# Patient Record
Sex: Male | Born: 1980 | Race: White | Hispanic: No | Marital: Married | Smoking: Never smoker
Health system: Southern US, Community
[De-identification: ages and names within clinical notes are randomized; demographics above are authoritative.]

## PROBLEM LIST (undated history)

## (undated) DIAGNOSIS — G8929 Other chronic pain: Secondary | ICD-10-CM

## (undated) DIAGNOSIS — M549 Dorsalgia, unspecified: Secondary | ICD-10-CM

## (undated) HISTORY — PX: TONSILLECTOMY: SUR1361

## (undated) HISTORY — PX: LUMBAR DISC SURGERY: SHX700

## (undated) HISTORY — PX: WISDOM TOOTH EXTRACTION: SHX21

---
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
## 2011-10-17 ENCOUNTER — Encounter (HOSPITAL_COMMUNITY): Payer: Self-pay | Admitting: *Deleted

## 2011-10-17 ENCOUNTER — Emergency Department (HOSPITAL_COMMUNITY)
Admission: EM | Admit: 2011-10-17 | Discharge: 2011-10-17 | Disposition: A | Discharge status: Home / Self Care | Payer: Worker's Compensation | Attending: Emergency Medicine | Admitting: Emergency Medicine

## 2011-10-17 DIAGNOSIS — Y9269 Other specified industrial and construction area as the place of occurrence of the external cause: Secondary | ICD-10-CM | POA: Insufficient documentation

## 2011-10-17 DIAGNOSIS — M549 Dorsalgia, unspecified: Secondary | ICD-10-CM

## 2011-10-17 DIAGNOSIS — Z881 Allergy status to other antibiotic agents status: Secondary | ICD-10-CM | POA: Insufficient documentation

## 2011-10-17 DIAGNOSIS — M5380 Other specified dorsopathies, site unspecified: Secondary | ICD-10-CM | POA: Insufficient documentation

## 2011-10-17 DIAGNOSIS — M545 Low back pain, unspecified: Secondary | ICD-10-CM | POA: Insufficient documentation

## 2011-10-17 DIAGNOSIS — M62830 Muscle spasm of back: Secondary | ICD-10-CM

## 2011-10-17 DIAGNOSIS — X500XXA Overexertion from strenuous movement or load, initial encounter: Secondary | ICD-10-CM | POA: Insufficient documentation

## 2011-10-17 MED ORDER — OXYCODONE-ACETAMINOPHEN 5-325 MG PO TABS: ORAL_TABLET | ORAL | Status: AC

## 2011-10-17 MED ORDER — HYDROMORPHONE HCL PF 1 MG/ML IJ SOLN
1.0000 mg | Freq: Once | INTRAMUSCULAR | Status: AC
  Administered 2011-10-17: 1 mg via INTRAMUSCULAR
  Filled 2011-10-17: qty 1

## 2011-10-17 MED ORDER — DIAZEPAM 5 MG PO TABS
10.0000 mg | ORAL_TABLET | Freq: Once | ORAL | Status: AC
  Administered 2011-10-17: 10 mg via ORAL
  Filled 2011-10-17: qty 2

## 2011-10-17 MED ORDER — IBUPROFEN 800 MG PO TABS: 800.0000 mg | ORAL_TABLET | Freq: Three times a day (TID) | ORAL | Status: AC

## 2011-10-17 MED ORDER — KETOROLAC TROMETHAMINE 60 MG/2ML IM SOLN
60.0000 mg | Freq: Once | INTRAMUSCULAR | Status: AC
  Administered 2011-10-17: 60 mg via INTRAMUSCULAR
  Filled 2011-10-17: qty 2

## 2011-10-17 MED ORDER — DIAZEPAM 5 MG PO TABS: 5.0000 mg | ORAL_TABLET | Freq: Three times a day (TID) | ORAL | Status: AC | PRN

## 2011-10-17 NOTE — ED Notes (Signed)
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  Pain in his lower back with radiation to his right leg (pain is sharp) has been increasing since his injury on Friday.  Pain unrelieved by  Ibuprofen at home.  No numbness or tingling, no loss of bowel or bladder control.  Pt is ambulatory in triage

## 2011-10-17 NOTE — Discharge Instructions (Signed)
Back Pain, Adult Low back pain is very common. About 1 in 5 people have back pain.The cause of low back pain is rarely dangerous. The pain often gets better over time.About half of people with a sudden onset of back pain feel better in just 2 weeks. About 8 in 10 people feel better by 6 weeks.  CAUSES Some common causes of back pain include:  Strain of the muscles or ligaments supporting the spine.   Wear and tear (degeneration) of the spinal discs.   Arthritis.   Direct injury to the back.  DIAGNOSIS Most of the time, the direct cause of low back pain is not known.However, back pain can be treated effectively even when the exact cause of the pain is unknown.Answering your caregiver's questions about your overall health and symptoms is one of the most accurate ways to make sure the cause of your pain is not dangerous. If your caregiver needs more information, he or she may order lab work or imaging tests (X-rays or MRIs).However, even if imaging tests show changes in your back, this usually does not require surgery. HOME CARE INSTRUCTIONS For many people, back pain returns.Since low back pain is rarely dangerous, it is often a condition that people can learn to manageon their own.   Remain active. It is stressful on the back to sit or stand in one place. Do not sit, drive, or stand in one place for more than 30 minutes at a time. Take short walks on level surfaces as soon as pain allows.Try to increase the length of time you walk each day.   Do not stay in bed.Resting more than 1 or 2 days can delay your recovery.   Do not avoid exercise or work.Your body is made to move.It is not dangerous to be active, even though your back may hurt.Your back will likely heal faster if you return to being active before your pain is gone.   Pay attention to your body when you bend and lift. Many people have less discomfortwhen lifting if they bend their knees, keep the load close to their  bodies,and avoid twisting. Often, the most comfortable positions are those that put less stress on your recovering back.   Find a comfortable position to sleep. Use a firm mattress and lie on your side with your knees slightly bent. If you lie on your back, put a pillow under your knees.   Only take over-the-counter or prescription medicines as directed by your caregiver. Over-the-counter medicines to reduce pain and inflammation are often the most helpful.Your caregiver may prescribe muscle relaxant drugs.These medicines help dull your pain so you can more quickly return to your normal activities and healthy exercise.   Put ice on the injured area.   Put ice in a plastic bag.   Place a towel between your skin and the bag.   Leave the ice on for 15 to 20 minutes, 3 to 4 times a day for the first 2 to 3 days. After that, ice and heat may be alternated to reduce pain and spasms.   Ask your caregiver about trying back exercises and gentle massage. This may be of some benefit.   Avoid feeling anxious or stressed.Stress increases muscle tension and can worsen back pain.It is important to recognize when you are anxious or stressed and learn ways to manage it.Exercise is a great option.  SEEK MEDICAL CARE IF:  You have pain that is not relieved with rest or medicine.   You have   pain that does not improve in 1 week.   You have new symptoms.   You are generally not feeling well.  SEEK IMMEDIATE MEDICAL CARE IF:   You have pain that radiates from your back into your legs.   You develop new bowel or bladder control problems.   You have unusual weakness or numbness in your arms or legs.   You develop nausea or vomiting.   You develop abdominal pain.   You feel faint.  Document Released: 04/06/2005 Document Revised: 03/26/2011 Document Reviewed: 08/25/2010 [facility] Patient Information 2012 [place], [place].    Back Exercises Back exercises help treat and prevent back injuries.  The goal of back exercises is to increase the strength of your abdominal and back muscles and the flexibility of your back. These exercises should be started when you no longer have back pain. Back exercises include:  Pelvic Tilt. Lie on your back with your knees bent. Tilt your pelvis until the lower part of your back is against the floor. Hold this position 5 to 10 sec and repeat 5 to 10 times.   Knee to Chest. Pull first 1 knee up against your chest and hold for 20 to 30 seconds, repeat this with the other knee, and then both knees. This may be done with the other leg straight or bent, whichever feels better.   Sit-Ups or Curl-Ups. Bend your knees 90 degrees. Start with tilting your pelvis, and do a partial, slow sit-up, lifting your trunk only 30 to 45 degrees off the floor. Take at least 2 to 3 seconds for each sit-up. Do not do sit-ups with your knees out straight. If partial sit-ups are difficult, simply do the above but with only tightening your abdominal muscles and holding it as directed.   Hip-Lift. Lie on your back with your knees flexed 90 degrees. Push down with your feet and shoulders as you raise your hips a couple inches off the floor; hold for 10 seconds, repeat 5 to 10 times.   Back arches. Lie on your stomach, propping yourself up on bent elbows. Slowly press on your hands, causing an arch in your low back. Repeat 3 to 5 times. Any initial stiffness and discomfort should lessen with repetition over time.   Shoulder-Lifts. Lie face down with arms beside your body. Keep hips and torso pressed to floor as you slowly lift your head and shoulders off the floor.  Do not overdo your exercises, especially in the beginning. Exercises may cause you some mild back discomfort which lasts for a few minutes; however, if the pain is more severe, or lasts for more than 15 minutes, do not continue exercises until you see your caregiver. Improvement with exercise therapy for back problems is slow.    See your caregivers for assistance with developing a proper back exercise program. Document Released: 05/14/2004 Document Revised: 03/26/2011 Document Reviewed: 04/06/2005 [facility] Patient Information 2012 [place], [place].

## 2011-10-17 NOTE — ED Provider Notes (Addendum)
History    This chart was scribed for Ryan Boyd. Oletta Lamas, MD, MD by Smitty Pluck. The patient was seen in room [facility] and the patient's care was started at 6:01PM.   CSN: [id number]  Arrival date & time 10/17/11  1745   None     Chief Complaint  Patient presents with  . Back Pain    (Consider location/radiation/quality/duration/timing/severity/associated sxs/prior treatment) Patient is a 31 y.o. male presenting with back pain. The history is provided by the patient.  Back Pain  Pertinent negatives include no fever, no numbness, no headaches, no abdominal pain, no dysuria and no weakness.   Eliodoro Gullett is a 31 y.o. male who presents to the Emergency Department complaining of constant, moderate, sharp lower back pain radiating down right leg onset 4 days. Pt was at work and lifted a heavy tire while turning. Pt has taken ibuprofen without relief. He denies numbness. Pt is ambulatory. Denies any other pain. Pt reports that laying flat does not alleviate pain. Certain movements makes pain worse, sometimes pains wake him form sleep.  No urinary symptoms.  No rash.    History reviewed. No pertinent past medical history.  History reviewed. No pertinent past surgical history.  History reviewed. No pertinent family history.  History  Substance Use Topics  . Smoking status: Never Smoker   . Smokeless tobacco: Not on file  . Alcohol Use: No      Review of Systems  Constitutional: Negative for fever and chills.  Respiratory: Negative for cough and shortness of breath.   Gastrointestinal: Negative for nausea, vomiting, abdominal pain and diarrhea.  Genitourinary: Positive for flank pain. Negative for dysuria, urgency, frequency, decreased urine volume and difficulty urinating.  Musculoskeletal: Positive for back pain and arthralgias.  Skin: Negative for rash and wound.  Neurological: Negative for weakness, numbness and headaches.    Allergies  Erythromycin  Home Medications    Current Outpatient Rx  Name Route Sig Dispense Refill  . DIAZEPAM 5 MG PO TABS Oral Take 1 tablet (5 mg total) by mouth every 8 (eight) hours as needed (for muscle spasms). 15 tablet 0  . IBUPROFEN 800 MG PO TABS Oral Take 1 tablet (800 mg total) by mouth 3 (three) times daily. 21 tablet 0  . OXYCODONE-ACETAMINOPHEN 5-325 MG PO TABS  1-2 tablets po q 6 hours prn moderate to severe pain 20 tablet 0    BP 137/76  Pulse 97  Temp 98.2 F (36.8 C) (Oral)  Resp 18  SpO2 97%  Physical Exam  Nursing note and vitals reviewed. Constitutional: He is oriented to person, place, and time. He appears well-developed and well-nourished. No distress.  HENT:  Head: Normocephalic and atraumatic.  Eyes: Conjunctivae are normal. Pupils are equal, round, and reactive to light.  Neck: Normal range of motion. Neck supple.  Pulmonary/Chest: Effort normal. No respiratory distress.  Abdominal: Soft. He exhibits no distension. There is no tenderness.  Musculoskeletal:       Lumbar back: He exhibits decreased range of motion, tenderness, pain and spasm. He exhibits no bony tenderness, no deformity, no laceration and normal pulse.       No l-spinal tenderness Right paraspinal spasm and tenderness present 5/5 plantar and dorsiflexion of RLE, gross sensation intact Positive straight leg raise   Neurological: He is alert and oriented to person, place, and time. He has normal strength. No sensory deficit.  Reflex Scores:      Patellar reflexes are 2+ on the right side and 2+ on  the left side. Skin: Skin is warm and dry. No rash noted. No pallor.  Psychiatric: He has a normal mood and affect. His behavior is normal.    ED Course  Procedures (including critical care time) DIAGNOSTIC STUDIES: Oxygen Saturation is 97% on room air, normal by my interpretation.    COORDINATION OF CARE: 6:04PM EDP discusses pt ED treatment with pt.  6:15PM EDP orders medication: dilaudid 1 mg, Toradol 60 mg, valium 10  mg  Labs Reviewed - No data to display No results found.   1. Back pain   2. Muscle spasm of back     7:17 PM Pt feels much improved.  Still 5/5 strength plantar and dorsiflexion of right foot.  Pt's family request Dr. Newell Coral referral rather than Dr. Gerlene Fee which I wrote for.  Further reassurance given to family member who still had further concerns.  Re-iterated that pt did not fall or have a direct blow to back.  No fever.  No numbness or weakness to either lower extremity, foot.  Normal patellar reflexes.  No h/o drug abuse, cancer or immunocompromise.  Statistically, pt's pain will improve to baseline, although may take weeks to months.  Pt's pain only present for 4 days.  Pt understands that if develops urinary or bowel symptoms, numbness or weakness, imaging in the form of MRI may be indicated then.  I think pt indeed may have a disc herniation that is mild, however obvious muscle spasms are the source of the vast majority of his symptoms which do not require imaging.    MDM  I personally performed the services described in this documentation, which was scribed in my presence. The recorded information has been reviewed and considered.    Pt with severe spasms, I suspect he may have herniated disc.  No distal weakness or numbness.  NSAIDs, heat, will prescribe analgesics and muscle relaxant, valium.  Pt referred to Dr. Gerlene Fee.  Counseled about rest with minimal exertion initially, then gradually increasing stretching and ROM exercises.          Ryan Boyd. Oletta Lamas, MD 10/17/11 1830  Ryan Boyd. Oletta Lamas, MD 10/17/11 1918  Ryan Boyd. Oletta Lamas, MD 10/17/11 Ernestina [place]

## 2012-04-30 ENCOUNTER — Emergency Department (HOSPITAL_COMMUNITY)
Admission: EM | Admit: 2012-04-30 | Discharge: 2012-05-01 | Disposition: A | Discharge status: Home / Self Care | Payer: Worker's Compensation | Attending: Emergency Medicine | Admitting: Emergency Medicine

## 2012-04-30 ENCOUNTER — Encounter (HOSPITAL_COMMUNITY): Payer: Self-pay | Admitting: Emergency Medicine

## 2012-04-30 ENCOUNTER — Emergency Department (HOSPITAL_COMMUNITY): Payer: Worker's Compensation

## 2012-04-30 DIAGNOSIS — M545 Low back pain, unspecified: Secondary | ICD-10-CM | POA: Insufficient documentation

## 2012-04-30 DIAGNOSIS — Z79899 Other long term (current) drug therapy: Secondary | ICD-10-CM | POA: Insufficient documentation

## 2012-04-30 DIAGNOSIS — M549 Dorsalgia, unspecified: Secondary | ICD-10-CM

## 2012-04-30 DIAGNOSIS — G8929 Other chronic pain: Secondary | ICD-10-CM | POA: Insufficient documentation

## 2012-04-30 HISTORY — DX: Other chronic pain: G89.29

## 2012-04-30 HISTORY — DX: Dorsalgia, unspecified: M54.9

## 2012-04-30 MED ORDER — DIAZEPAM 5 MG PO TABS: 5.0000 mg | ORAL_TABLET | Freq: Two times a day (BID) | ORAL | Status: DC

## 2012-04-30 MED ORDER — HYDROCODONE-ACETAMINOPHEN 10-325 MG PO TABS: 1.0000 | ORAL_TABLET | Freq: Four times a day (QID) | ORAL | Status: DC | PRN

## 2012-04-30 MED ORDER — HYDROMORPHONE HCL PF 2 MG/ML IJ SOLN
4.0000 mg | Freq: Once | INTRAMUSCULAR | Status: AC
  Administered 2012-04-30: 4 mg via INTRAMUSCULAR
  Filled 2012-04-30: qty 2

## 2012-04-30 NOTE — ED Provider Notes (Signed)
History     CSN: [id number]  Arrival date & time 04/30/12  2039   First MD Initiated Contact with Patient 04/30/12 2320      Chief Complaint  Patient presents with  . Back Pain    (Consider location/radiation/quality/duration/timing/severity/associated sxs/prior treatment) HPI Comments: 32 year old male who has low back pain that has been ongoing for approximately 6 months, status post discectomy by Dr. Dutch Quint who is currently looking for a second opinion as he has had worsening back pain over the last several weeks. It is gradually getting worse, persistent, severe at times, worse with standing and associated with occasional burning and numbness to his feet. He denies any urinary symptoms, fever, IV drug use, cancer. He has had MRIs in the past. He is out of his pain medication and feeling severe pain today. He is working with workers compensation to obtain a secondary referral.  Patient is a 32 y.o. male presenting with back pain. The history is provided by the patient and a relative.  Back Pain     Past Medical History  Diagnosis Date  . Back pain, chronic     Past Surgical History  Procedure Date  . Back surgery     No family history on file.  History  Substance Use Topics  . Smoking status: Never Smoker   . Smokeless tobacco: Not on file  . Alcohol Use: No      Review of Systems  Musculoskeletal: Positive for back pain.  All other systems reviewed and are negative.    Allergies  Erythromycin and Prednisone  Home Medications   Current Outpatient Rx  Name  Route  Sig  Dispense  Refill  . DIAZEPAM 5 MG PO TABS   Oral   Take 10 mg by mouth every 6 (six) hours as needed. For anxiety         . HYDROCODONE-ACETAMINOPHEN 10-325 MG PO TABS   Oral   Take 1 tablet by mouth every 4 (four) hours as needed. For pain         . DIAZEPAM 5 MG PO TABS   Oral   Take 1 tablet (5 mg total) by mouth 2 (two) times daily.   10 tablet   0   .  HYDROCODONE-ACETAMINOPHEN 10-325 MG PO TABS   Oral   Take 1 tablet by mouth every 6 (six) hours as needed for pain.   30 tablet   0     BP 142/82  Pulse 92  Temp 98.9 F (37.2 C) (Oral)  Resp 14  SpO2 95%  Physical Exam  Nursing note and vitals reviewed. Constitutional: He appears well-developed and well-nourished.       Uncomfortable appearing  HENT:  Head: Normocephalic and atraumatic.  Mouth/Throat: Oropharynx is clear and moist. No oropharyngeal exudate.  Eyes: Conjunctivae normal and EOM are normal. Pupils are equal, round, and reactive to light. Right eye exhibits no discharge. Left eye exhibits no discharge. No scleral icterus.  Neck: Normal range of motion. Neck supple. No JVD present. No thyromegaly present.  Cardiovascular: Normal rate, regular rhythm, normal heart sounds and intact distal pulses.  Exam reveals no gallop and no friction rub.   No murmur heard. Pulmonary/Chest: Effort normal and breath sounds normal. No respiratory distress. He has no wheezes. He has no rales.  Abdominal: Soft. Bowel sounds are normal. He exhibits no distension and no mass. There is no tenderness.  Musculoskeletal: Normal range of motion. He exhibits edema (scant bilateral ankle edema) and tenderness (  To palpation across the lower back).  Lymphadenopathy:    He has no cervical adenopathy.  Neurological: He is alert. Coordination normal.       Normal sensation to all 4 extremities, normal strength to the bilateral hands, normal strength at the ankles bilaterally, pain with straight leg raise  Skin: Skin is warm and dry. No rash noted. No erythema.  Psychiatric: He has a normal mood and affect. His behavior is normal.    ED Course  Procedures (including critical care time)  Labs Reviewed - No data to display Dg Lumbar Spine Complete  04/30/2012  *RADIOLOGY REPORT*  Clinical Data: Low back pain.  LUMBAR SPINE - COMPLETE 4+ VIEW  Comparison: 01/14/2012  Findings: Early degenerative  disc disease changes in the lower lumbar spine at L4-5 and L5-S1.  Degenerative facet disease at these levels.  Normal alignment.  No fracture.  SI joints are symmetric and unremarkable.  IMPRESSION: Degenerative disc and facet disease in the lower lumbar spine.  No acute findings.   Original Report Authenticated By: Charlett Nose, M.D.      1. Back pain       MDM  The patient is clearly in pain, he likely has recurrent lumbar disease, to have any focal neurologic deficits there is nothing acute tonight that was not there in the last couple of weeks and he does not have any loss of his bladder control. His vital signs are normal, he will be discharged home with referrals for pain clinic, medication refills, intramuscular Dilaudid given prior to discharge.        Vida Roller, MD 04/30/12 [phone]

## 2012-04-30 NOTE — ED Notes (Signed)
PT. PROGRESSING CHRONIC LOW BACK PAIN RADIATING TO RIGHT LEG FOR SEVERAL DAYS WORSE TODAY , DENIES INJURY OR FALL  , AMBULATORY , STATES HISTORY OF BACK SURGERY / LUMBAR DISC SURGERY.

## 2012-04-30 NOTE — ED Notes (Signed)
Family at bedside. 

## 2012-04-30 NOTE — ED Notes (Signed)
Pt REPORTS HE WAS RELEASED FROM HIS CURRENT MD. BECAUSE HE DID NOT WANT STEROID INJECTIONS IN HIS BACK.  PT HAS A REPORTED ALLERGIE TO PREDNISONE (HIVES) . [facility] IS LOOKING FOR ANOTHER MD. TO TREAT BACK INJURY. PT REPORTS PAIN HAS INCREASED.

## 2012-05-01 MED ORDER — HYDROCODONE-ACETAMINOPHEN 10-325 MG PO TABS: 1.0000 | ORAL_TABLET | Freq: Four times a day (QID) | ORAL | Status: DC | PRN

## 2012-05-01 MED ORDER — DIAZEPAM 5 MG PO TABS: 5.0000 mg | ORAL_TABLET | Freq: Two times a day (BID) | ORAL | Status: DC

## 2012-05-16 ENCOUNTER — Emergency Department (HOSPITAL_COMMUNITY)
Admission: EM | Admit: 2012-05-16 | Discharge: 2012-05-16 | Disposition: A | Discharge status: Home / Self Care | Payer: Worker's Compensation | Attending: Emergency Medicine | Admitting: Emergency Medicine

## 2012-05-16 ENCOUNTER — Encounter (HOSPITAL_COMMUNITY): Payer: Self-pay | Admitting: *Deleted

## 2012-05-16 DIAGNOSIS — M549 Dorsalgia, unspecified: Secondary | ICD-10-CM | POA: Insufficient documentation

## 2012-05-16 DIAGNOSIS — G8929 Other chronic pain: Secondary | ICD-10-CM | POA: Insufficient documentation

## 2012-05-16 DIAGNOSIS — Z79899 Other long term (current) drug therapy: Secondary | ICD-10-CM | POA: Insufficient documentation

## 2012-05-16 DIAGNOSIS — Z9889 Other specified postprocedural states: Secondary | ICD-10-CM | POA: Insufficient documentation

## 2012-05-16 DIAGNOSIS — R209 Unspecified disturbances of skin sensation: Secondary | ICD-10-CM | POA: Insufficient documentation

## 2012-05-16 MED ORDER — DIAZEPAM 5 MG PO TABS: 5.0000 mg | ORAL_TABLET | Freq: Two times a day (BID) | ORAL | Status: DC

## 2012-05-16 MED ORDER — HYDROCODONE-ACETAMINOPHEN 10-325 MG PO TABS: 2.0000 | ORAL_TABLET | ORAL | Status: DC | PRN

## 2012-05-16 MED ORDER — HYDROMORPHONE HCL 2 MG PO TABS
4.0000 mg | ORAL_TABLET | ORAL | Status: AC
  Administered 2012-05-16: 4 mg via ORAL
  Filled 2012-05-16: qty 2

## 2012-05-16 MED ORDER — DIAZEPAM 5 MG PO TABS
5.0000 mg | ORAL_TABLET | Freq: Once | ORAL | Status: AC
  Administered 2012-05-16: 5 mg via ORAL
  Filled 2012-05-16: qty 1

## 2012-05-16 NOTE — ED Notes (Signed)
Pt out of medication for his chronic back pain.  Dr. Dutch Quint his surgeon-states that L4 has a tear in it and needs to be repaired.  Tried to get medication filled but is unable.

## 2012-05-16 NOTE — ED Provider Notes (Signed)
History   This chart was scribed for Dione Booze, MD by Charolett Bumpers, ED Scribe. The patient was seen in room TR05C/TR05C. Patient's care was started at 2105.    CSN: [id number]  Arrival date & time 05/16/12  2059   First MD Initiated Contact with Patient 05/16/12 2105      Chief Complaint  Patient presents with  . Back Pain    The history is provided by the patient. No language interpreter was used.  Ryan Boyd is a 32 y.o. male with a history of chronic back pain who presents to the Emergency Department complaining of constant moderate back pain near L1-S5 that radiates from his buttocks down towards his feet. Pt says he injured his back by tearing his L4 disk 3 months ago but continued to work before having to have surgery. He rates the pain a 10/10 and reports associated numbness and edema. He denies bowl incontinence. Pt says standing on hard floors for 45 minutes makes his pain worse. He says he took the hydrocodone 10-25 prescribed for his back pain but has run out due to a recent change in prescription quantity. He tried to get his medication filled but was unsuccessful. He says his next appointment is in February 2014. Pt denies smoking or alcohol consumption.    Past Medical History  Diagnosis Date  . Back pain, chronic     Past Surgical History  Procedure Date  . Back surgery     History reviewed. No pertinent family history.  History  Substance Use Topics  . Smoking status: Never Smoker   . Smokeless tobacco: Not on file  . Alcohol Use: No      Review of Systems  Musculoskeletal: Positive for back pain. Negative for gait problem.  Neurological: Positive for numbness. Negative for speech difficulty.  All other systems reviewed and are negative.    Allergies  Erythromycin and Prednisone  Home Medications   Current Outpatient Rx  Name  Route  Sig  Dispense  Refill  . DIAZEPAM 5 MG PO TABS   Oral   Take 10 mg by mouth every 6 (six) hours  as needed. For anxiety         . DIAZEPAM 5 MG PO TABS   Oral   Take 1 tablet (5 mg total) by mouth 2 (two) times daily.   10 tablet   0   . HYDROCODONE-ACETAMINOPHEN 10-325 MG PO TABS   Oral   Take 1 tablet by mouth every 4 (four) hours as needed. For pain         . HYDROCODONE-ACETAMINOPHEN 10-325 MG PO TABS   Oral   Take 1 tablet by mouth every 6 (six) hours as needed for pain.   30 tablet   0     BP 141/81  Pulse 115  Temp 97.5 F (36.4 C) (Oral)  Resp 16  SpO2 100%  Physical Exam  Nursing note and vitals reviewed. Constitutional: He is oriented to person, place, and time. He appears well-developed and well-nourished. No distress.       Appears to be in pain  HENT:  Head: Normocephalic and atraumatic.  Eyes: EOM are normal.  Neck: Neck supple. No tracheal deviation present.  Cardiovascular: Normal rate.   Pulmonary/Chest: Effort normal. No respiratory distress.  Musculoskeletal: Normal range of motion. He exhibits tenderness.       Tender throughout the lumbaer spine  bilateral paraspinal spasm right greater than left  Positive   Neurological: He is  alert and oriented to person, place, and time.  Skin: Skin is warm and dry.  Psychiatric: He has a normal mood and affect. His behavior is normal.    ED Course  Procedures (including critical care time)  DIAGNOSTIC STUDIES: Oxygen Saturation is 100% on room air, normal by my interpretation.    COORDINATION OF CARE: 2118-Patient informed of current plan for treatment and evaluation and agrees with plan at this time.    1. Exacerbation of chronic back pain       MDM  Exacerbation of chronic back pain. I reviewed his medication record on [place] controlled substance reporting website and prescriptions given at his history. I have discussed his case with his neurosurgeon, Dr. Jordan Likes, who has requested that I give him enough medication to get through tomorrow and he will arrange for prescription refill  through his office tomorrow.    I personally performed the services described in this documentation, which was scribed in my presence. The recorded information has been reviewed and is accurate.       Dione Booze, MD 05/16/12 2131

## 2012-10-06 ENCOUNTER — Emergency Department (HOSPITAL_COMMUNITY)
Admission: EM | Admit: 2012-10-06 | Discharge: 2012-10-06 | Disposition: A | Discharge status: Home / Self Care | Payer: Worker's Compensation | Attending: Emergency Medicine | Admitting: Emergency Medicine

## 2012-10-06 ENCOUNTER — Encounter (HOSPITAL_COMMUNITY): Payer: Self-pay | Admitting: Emergency Medicine

## 2012-10-06 DIAGNOSIS — Z9889 Other specified postprocedural states: Secondary | ICD-10-CM | POA: Insufficient documentation

## 2012-10-06 DIAGNOSIS — M545 Low back pain, unspecified: Secondary | ICD-10-CM | POA: Insufficient documentation

## 2012-10-06 DIAGNOSIS — R209 Unspecified disturbances of skin sensation: Secondary | ICD-10-CM | POA: Insufficient documentation

## 2012-10-06 DIAGNOSIS — G8929 Other chronic pain: Secondary | ICD-10-CM | POA: Insufficient documentation

## 2012-10-06 DIAGNOSIS — Z79899 Other long term (current) drug therapy: Secondary | ICD-10-CM | POA: Insufficient documentation

## 2012-10-06 MED ORDER — HYDROMORPHONE HCL PF 2 MG/ML IJ SOLN: 2.0000 mg | Freq: Once | INTRAMUSCULAR | Status: DC

## 2012-10-06 MED ORDER — HYDROMORPHONE HCL PF 2 MG/ML IJ SOLN
2.0000 mg | INTRAMUSCULAR | Status: AC | PRN
  Administered 2012-10-06: 2 mg via INTRAMUSCULAR
  Filled 2012-10-06: qty 1

## 2012-10-06 MED ORDER — DIAZEPAM 5 MG PO TABS: ORAL_TABLET | ORAL | Status: DC

## 2012-10-06 MED ORDER — DIAZEPAM 5 MG PO TABS
5.0000 mg | ORAL_TABLET | Freq: Once | ORAL | Status: AC
  Administered 2012-10-06: 5 mg via ORAL
  Filled 2012-10-06: qty 1

## 2012-10-06 NOTE — ED Provider Notes (Signed)
History     CSN: [id number]  Arrival date & time 10/06/12  1655   First MD Initiated Contact with Patient 10/06/12 1706      Chief Complaint  Patient presents with  . Back Pain    (Consider location/radiation/quality/duration/timing/severity/associated sxs/prior treatment) HPI Comments: 32 y.o. Male with PMHx of chronic back pain presents with exacerbation of same. Pt states he was sitting in a chair with heating pad, stood up, and felt sharp pains in his lower back shooting down the backs of both thighs. Pt states this pain is similar to back pain he has had in the past in the L15-S1 region. Pain described as severe (10/10), constant, worse with movement, numbness radiating down bilateral posterior legs to feet. Interventions include his prescribed pain regiment of 10-325 norco and 5mg  valium which he took today without relief. Denies fever, loss of bowel or bladder control, night sweats, weight loss, h/o cancer, IVDU.    Pt is seen by Dr. Jordan Likes who is in the process of developing a pain management contract for pt. Pt did call Dr. Lindalou Hose office today and left message.   Patient is a 32 y.o. male presenting with back pain.  Back Pain Associated symptoms: numbness   Associated symptoms: no abdominal pain, no chest pain, no dysuria, no fever, no headaches and no weakness     Past Medical History  Diagnosis Date  . Back pain, chronic     Past Surgical History  Procedure Laterality Date  . Back surgery      History reviewed. No pertinent family history.  History  Substance Use Topics  . Smoking status: Never Smoker   . Smokeless tobacco: Not on file  . Alcohol Use: No      Review of Systems  Constitutional: Negative for fever and diaphoresis.  HENT: Negative for neck pain and neck stiffness.   Eyes: Negative for visual disturbance.  Respiratory: Negative for apnea, chest tightness and shortness of breath.   Cardiovascular: Negative for chest pain and palpitations.   Gastrointestinal: Negative for nausea, vomiting, abdominal pain, diarrhea and constipation.  Genitourinary: Negative for dysuria.  Musculoskeletal: Positive for back pain. Negative for gait problem.       Lumbar pain bilaterally  Skin: Negative for rash.  Neurological: Positive for numbness. Negative for dizziness, weakness, light-headedness and headaches.       Bilateral posterior thighs to feet    Allergies  Erythromycin; Other; and Prednisone  Home Medications   Current Outpatient Rx  Name  Route  Sig  Dispense  Refill  . diazepam (VALIUM) 5 MG tablet   Oral   Take 5 mg by mouth every 6 (six) hours.         Marland Kitchen HYDROcodone-acetaminophen (NORCO) 10-325 MG per tablet   Oral   Take 1 tablet by mouth every 4 (four) hours.           BP 119/75  Pulse 103  Temp(Src) 97.5 F (36.4 C) (Oral)  Resp 14  SpO2 96%  Physical Exam  Nursing note and vitals reviewed. Constitutional: He is oriented to person, place, and time. No distress.  Pt appears uncomfortable  HENT:  Head: Normocephalic and atraumatic.  Eyes: Conjunctivae and EOM are normal.  Neck: Normal range of motion. Neck supple.  No meningeal signs  Cardiovascular: Normal rate, regular rhythm and normal heart sounds.  Exam reveals no gallop and no friction rub.   No murmur heard. Pulmonary/Chest: Effort normal and breath sounds normal. No respiratory distress. He  has no wheezes. He has no rales. He exhibits no tenderness.  Abdominal: Soft. Bowel sounds are normal. He exhibits no distension. There is no tenderness. There is no rebound and no guarding.  Musculoskeletal: Normal range of motion. He exhibits no edema and no tenderness.  FROM to upper and lower extremities Full range of motion of C-spine, T-spine or L-spine Tenderness to palpation of the lumbar/scaral paraspinous muscles Pain with bilateral straight leg raise approx 30 degrees  Neurological: He is alert and oriented to person, place, and time. No cranial  nerve deficit.  Speech is clear and goal oriented, follows commands Sensation normal to light touch Moves extremities without ataxia, coordination intact Normal gait and balance Normal strength in upper and lower extremities bilaterally including dorsiflexion and plantar flexion, strong and equal grip strength  Skin: Skin is warm and dry. He is not diaphoretic. No erythema.  Psychiatric: He has a normal mood and affect.    ED Course  Procedures (including critical care time)  Medications  diazepam (VALIUM) tablet 5 mg (5 mg Oral Given 10/06/12 1747)  HYDROmorphone (DILAUDID) injection 2 mg (2 mg Intramuscular Given 10/06/12 1749)  HYDROmorphone (DILAUDID) injection 2 mg (2 mg Intramuscular Given 10/06/12 1855)    Filed Vitals:   10/06/12 1701 10/06/12 1754  BP: 119/75 128/79  Pulse: 103 85  Temp: 97.5 F (36.4 C)   TempSrc: Oral   Resp: 14   SpO2: 96% 96%     Labs Reviewed - No data to display No results found.   1. Chronic back pain greater than 3 months duration       MDM  Patient with back pain.  No neurological deficits and normal neuro exam.  Patient can walk but states is painful.  No loss of bowel or bladder control.  No concern for cauda equina.  No fever, night sweats, weight loss, h/o cancer, IVDU.  Pt has had regular follow up with neurologist whose plan includes pain management and possible surgery. Will manage pt pain with dilaudid and valium and re-evaluate.   Pain was managed successfulyl. Pt is at baseline. Pt ambulated well in ED. Vitals are stable. Pt states he will follow up with Dr. Jordan Likes in the morning. Declined pain meds stating he just received a refill but will give some Valium as he is scheduled to pick up his prescription on Saturday. Discussed reasons to seek immediate care. Patient expresses understanding and agrees with plan.   Glade Nurse, PA-C 10/06/12 1921

## 2012-10-06 NOTE — ED Notes (Signed)
Pt c/o lower back pan upon standing today; pt sts hx of back sx and nerve issues; pt sts unable to straighten up without intense pain

## 2012-10-07 NOTE — ED Provider Notes (Signed)
Medical screening examination/treatment/procedure(s) were performed by non-physician practitioner and as supervising physician I was immediately available for consultation/collaboration.  Santiago Graf L Preethi Scantlebury, MD 10/07/12 0050 

## 2013-03-07 ENCOUNTER — Emergency Department (HOSPITAL_COMMUNITY)
Admission: EM | Admit: 2013-03-07 | Discharge: 2013-03-08 | Disposition: A | Discharge status: Home / Self Care | Payer: Worker's Compensation | Attending: Emergency Medicine | Admitting: Emergency Medicine

## 2013-03-07 ENCOUNTER — Encounter (HOSPITAL_COMMUNITY): Payer: Self-pay | Admitting: Emergency Medicine

## 2013-03-07 DIAGNOSIS — G8929 Other chronic pain: Secondary | ICD-10-CM

## 2013-03-07 DIAGNOSIS — M545 Low back pain, unspecified: Secondary | ICD-10-CM | POA: Insufficient documentation

## 2013-03-07 DIAGNOSIS — R209 Unspecified disturbances of skin sensation: Secondary | ICD-10-CM | POA: Insufficient documentation

## 2013-03-07 DIAGNOSIS — Z9889 Other specified postprocedural states: Secondary | ICD-10-CM | POA: Insufficient documentation

## 2013-03-07 MED ORDER — HYDROMORPHONE HCL PF 2 MG/ML IJ SOLN
2.0000 mg | Freq: Once | INTRAMUSCULAR | Status: AC
  Administered 2013-03-07: 2 mg via INTRAMUSCULAR
  Filled 2013-03-07: qty 1

## 2013-03-07 MED ORDER — DIAZEPAM 5 MG/ML IJ SOLN
5.0000 mg | Freq: Once | INTRAMUSCULAR | Status: AC
  Administered 2013-03-07: 5 mg via INTRAVENOUS
  Filled 2013-03-07: qty 2

## 2013-03-07 MED ORDER — DIAZEPAM 5 MG/ML IJ SOLN
5.0000 mg | Freq: Once | INTRAMUSCULAR | Status: AC
  Administered 2013-03-07: 5 mg via INTRAMUSCULAR
  Filled 2013-03-07: qty 2

## 2013-03-07 MED ORDER — HYDROMORPHONE HCL PF 1 MG/ML IJ SOLN
1.0000 mg | Freq: Once | INTRAMUSCULAR | Status: AC
  Administered 2013-03-07: 1 mg via INTRAMUSCULAR
  Filled 2013-03-07: qty 1

## 2013-03-07 NOTE — ED Notes (Signed)
Family at bedside. 

## 2013-03-07 NOTE — ED Notes (Signed)
Pt injured back 07-2011 has had one surgery and is awaiting another surgery.  Activity is limited to walking only.  Is taking Hydrocodone x 2 qd, Baclofen, Topamax.  Onset yesterday pain worsened, feels spasms, medications not effective.  Has had several episodes of severe pain and spasms in the past and was treated with pain meds.  Pt is allergic to steroids.

## 2013-03-07 NOTE — ED Provider Notes (Signed)
CSN: [id number]     Arrival date & time 03/07/13  2038 History  This chart was scribed for non-physician practitioner Sharilyn Sites, PA-C working with Toy Baker, MD by Danella Maiers, ED Scribe. This patient was seen in room TR07C/TR07C and the patient's care was started at 8:42 PM.   Chief Complaint  Patient presents with  . Back Pain   The history is provided by the patient. No language interpreter was used.   HPI Comments: Ryan Boyd is a 32 y.o. male with a h/o chronic back pain and back surgery who presents to the Emergency Department complaining of exacerbation of chronic back pain due to herniated disc for the past 2 days. He has already had one back surgery and is about to undergo a spinal fusion surgery by Dr. Shelle Iron.  Patient is currently in pain management and has been taking his prescribed baclofen and hydrocodone without noted improvement. He denies any recent injury, trauma, or falls. He has intermittent paresthesias of his lower extremities at baseline-- this is unchanged. Denies any weakness of LE.  Denies any loss of bowel or bladder function.  Past Medical History  Diagnosis Date  . Back pain, chronic    Past Surgical History  Procedure Laterality Date  . Back surgery     No family history on file. History  Substance Use Topics  . Smoking status: Never Smoker   . Smokeless tobacco: Not on file  . Alcohol Use: No    Review of Systems  Musculoskeletal: Positive for back pain.  All other systems reviewed and are negative.    Allergies  Erythromycin; Other; and Prednisone  Home Medications   Current Outpatient Rx  Name  Route  Sig  Dispense  Refill  . diazepam (VALIUM) 5 MG tablet   Oral   Take 5 mg by mouth every 6 (six) hours.         . diazepam (VALIUM) 5 MG tablet      Take one pill by mouth at bedtime as muscle relaxer.   4 tablet   0   . HYDROcodone-acetaminophen (NORCO) 10-325 MG per tablet   Oral   Take 1 tablet by mouth every 4  (four) hours.          BP 124/78  Pulse 105  Temp(Src) 97.4 F (36.3 C) (Oral)  Resp 18  Wt 231 lb 5 oz (104.923 kg)  SpO2 99% Physical Exam  Nursing note and vitals reviewed. Constitutional: He is oriented to person, place, and time. He appears well-developed and well-nourished. No distress.  HENT:  Head: Normocephalic and atraumatic.  Mouth/Throat: Oropharynx is clear and moist.  Eyes: Conjunctivae and EOM are normal. Pupils are equal, round, and reactive to light.  Neck: Normal range of motion. Neck supple.  Cardiovascular: Normal rate, regular rhythm and normal heart sounds.   Pulmonary/Chest: Effort normal and breath sounds normal. No respiratory distress. He has no wheezes.  Musculoskeletal:       Lumbar back: He exhibits decreased range of motion, tenderness, bony tenderness, pain and spasm. He exhibits no swelling, no edema, no deformity, no laceration and normal pulse.  Diffuse TTP of LS without noted deformity; spasms present bilaterally; limited ROM due to pain; distal sensation intact; gait slow but non-ataxic  Neurological: He is alert and oriented to person, place, and time.  Skin: Skin is warm and dry. He is not diaphoretic.  Psychiatric: He has a normal mood and affect.    ED Course  Procedures (including  critical care time) Medications - No data to display  DIAGNOSTIC STUDIES: Oxygen Saturation is 99% on RA, normal by my interpretation.    COORDINATION OF CARE: 9:09 PM- Discussed treatment plan with pt. Pt agrees to plan.    Labs Review Labs Reviewed - No data to display Imaging Review No results found.  EKG Interpretation   None       MDM   1. Chronic back pain    Pt without focal neuro deficits, no concern for cauda equina.  Patient given multiple doses of pain medication and muscle relaxers in the ED with mild improvement. He is in pain management currently and no other medications can be given for home use. He will followup with Dr. Shelle Iron  as previously scheduled. Discussed plan with patient and wife, they agreed. Return precautions advised.  I personally performed the services described in this documentation, which was scribed in my presence. The recorded information has been reviewed and is accurate.  Garlon Hatchet, PA-C 03/08/13 [phone]

## 2013-03-09 NOTE — ED Provider Notes (Signed)
Medical screening examination/treatment/procedure(s) were performed by non-physician practitioner and as supervising physician I was immediately available for consultation/collaboration.  Toy Baker, MD 03/09/13 [phone]

## 2013-06-16 ENCOUNTER — Emergency Department (HOSPITAL_COMMUNITY)
Admission: EM | Admit: 2013-06-16 | Discharge: 2013-06-16 | Disposition: A | Discharge status: Home / Self Care | Payer: Worker's Compensation | Attending: Emergency Medicine | Admitting: Emergency Medicine

## 2013-06-16 ENCOUNTER — Encounter (HOSPITAL_COMMUNITY): Payer: Self-pay | Admitting: Emergency Medicine

## 2013-06-16 DIAGNOSIS — Z79899 Other long term (current) drug therapy: Secondary | ICD-10-CM | POA: Insufficient documentation

## 2013-06-16 DIAGNOSIS — M549 Dorsalgia, unspecified: Secondary | ICD-10-CM

## 2013-06-16 DIAGNOSIS — R209 Unspecified disturbances of skin sensation: Secondary | ICD-10-CM | POA: Insufficient documentation

## 2013-06-16 DIAGNOSIS — M545 Low back pain, unspecified: Secondary | ICD-10-CM | POA: Insufficient documentation

## 2013-06-16 DIAGNOSIS — G8929 Other chronic pain: Secondary | ICD-10-CM | POA: Insufficient documentation

## 2013-06-16 DIAGNOSIS — Z9889 Other specified postprocedural states: Secondary | ICD-10-CM | POA: Insufficient documentation

## 2013-06-16 DIAGNOSIS — M79609 Pain in unspecified limb: Secondary | ICD-10-CM | POA: Insufficient documentation

## 2013-06-16 MED ORDER — KETOROLAC TROMETHAMINE 60 MG/2ML IM SOLN
60.0000 mg | Freq: Once | INTRAMUSCULAR | Status: AC
  Administered 2013-06-16: 60 mg via INTRAMUSCULAR
  Filled 2013-06-16: qty 2

## 2013-06-16 MED ORDER — HYDROMORPHONE HCL PF 1 MG/ML IJ SOLN
1.0000 mg | Freq: Once | INTRAMUSCULAR | Status: AC
  Administered 2013-06-16: 1 mg via INTRAMUSCULAR
  Filled 2013-06-16: qty 1

## 2013-06-16 NOTE — ED Provider Notes (Signed)
CSN: [id number]     Arrival date & time 06/16/13  1208 History  This chart was scribed for non-physician practitioner, Lottie Musselatyana A Rigby Leonhardt, PA-C, working with Shelda JakesScott W. Zackowski, MD by Shari HeritageAisha Amuda, ED Scribe. This patient was seen in room TR10C/TR10C and the patient's care was started at 2:41 PM.    Chief Complaint  Patient presents with  . Back Pain     The history is provided by the patient. No language interpreter was used.    HPI Comments: Ryan Boyd is a 33 y.o. male with history of chronic back pain who presents to the Emergency Department complaining of an episode of throbbing severe lower back pain onset yesterday. Pain radiates down his right anterior leg to his foot. Patient states that he stepped down wrong on his foot and twisted his back and he is now having increased pain. Pain is worse with movement of his right leg. He has been taking Norco 10-325 mg bid and baclofen 10 mg tid. He has also been taking Tylenol, but none of these medications have given relief.  He states that he has intermittent numbness in his legs at baseline. He denies fever, weakness, bladder incontinence, or bowel incontinence. He states that he has had an MRI at [facility] which showed multiple pinched nerves in his lumbar area - this study was ordered by his PCP, Dr. Eduard ClosBethea. He has a prior history of back surgery: lumbar discectomy in August 2013. He has [facility] and patient states that he is planning to have another surgery but this has not been scheduled. Patient reports no other chronic medical conditions. He does not smoke.   Past Medical History  Diagnosis Date  . Back pain, chronic    Past Surgical History  Procedure Laterality Date  . Back surgery     History reviewed. No pertinent family history. History  Substance Use Topics  . Smoking status: Never Smoker   . Smokeless tobacco: Not on file  . Alcohol Use: No    Review of Systems   Gastrointestinal:       Negative for bowel incontinence.  Genitourinary:       Negative for bladder incontinence.  Musculoskeletal: Positive for back pain and myalgias (R leg pain).  Neurological: Positive for numbness. Negative for weakness.      Allergies  Erythromycin; Other; and Prednisone  Home Medications   Current Outpatient Rx  Name  Route  Sig  Dispense  Refill  . baclofen (LIORESAL) 10 MG tablet   Oral   Take 10 mg by mouth 3 (three) times daily.         . cloNIDine (CATAPRES - DOSED IN MG/24 HR) 0.3 mg/24hr patch   Transdermal   Place 1 patch onto the skin once a week.         Marland Kitchen. HYDROcodone-acetaminophen (NORCO) 10-325 MG per tablet   Oral   Take 1 tablet by mouth 2 (two) times daily.           There were no vitals taken for this visit. Physical Exam  Nursing note and vitals reviewed. Constitutional: He is oriented to person, place, and time. He appears well-developed and well-nourished. No distress.  HENT:  Head: Normocephalic and atraumatic.  Eyes: EOM are normal.  Neck: Neck supple. No tracheal deviation present.  Cardiovascular: Normal rate.   DP pulses intact and equal bilaterally  Pulmonary/Chest: Effort normal. No respiratory distress.  Musculoskeletal: Normal range of motion.  Midline lumbar spine  tenderness, bilateral paravertebral tenderness. Pain with bilateral straight straight leg raise.  Neurological: He is alert and oriented to person, place, and time.  5/5 and equal lower extremity strength. 2+ and equal patellar reflexes bilaterally. Pt able to dorsiflex bilateral toes and feet with good strength against resistance. Equal sensation bilaterally over thighs and lower legs.   Skin: Skin is warm and dry.  Psychiatric: He has a normal mood and affect. His behavior is normal.    ED Course  Procedures (including critical care time) COORDINATION OF CARE: 2:49 PM- Patient informed of current plan for treatment and evaluation and agrees  with plan at this time.     Labs Review Labs Reviewed - No data to display Imaging Review No results found.  EKG Interpretation  None  MDM   Final diagnoses:  Back pain    Patient emergency department with chronic back pain, states flared up 2 days ago. He is waiting on workers comp approval for surgery. He's on pain management and is here for pain control. I have given him 2 doses of 1 mg of Dilaudid IM, 60 mg of Toradol IM. He spits file in the control. He states he feels a lot better. He'll be discharging him home with no prescriptions, since he is on pain management. He will followup with his Dr. as soon as he is able. Today there is no signs of cauda equina, no new injuries, no concerning exam findings.   Filed Vitals:   06/16/13 1315  BP: 128/80  Pulse: 83  Temp: 97.6 F (36.4 C)  TempSrc: Oral  Resp: 16  SpO2: 96%    I personally performed the services described in this documentation, which was scribed in my presence. The recorded information has been reviewed and is accurate.    Lottie Mussel, PA-C 06/16/13 1658

## 2013-06-16 NOTE — Discharge Instructions (Signed)
Please followup with your primary care physician neurosurgeon for further treatment.    Back Pain, Adult Low back pain is very common. About 1 in 5 people have back pain.The cause of low back pain is rarely dangerous. The pain often gets better over time.About half of people with a sudden onset of back pain feel better in just 2 weeks. About 8 in 10 people feel better by 6 weeks.  CAUSES Some common causes of back pain include:  Strain of the muscles or ligaments supporting the spine.  Wear and tear (degeneration) of the spinal discs.  Arthritis.  Direct injury to the back. DIAGNOSIS Most of the time, the direct cause of low back pain is not known.However, back pain can be treated effectively even when the exact cause of the pain is unknown.Answering your caregiver's questions about your overall health and symptoms is one of the most accurate ways to make sure the cause of your pain is not dangerous. If your caregiver needs more information, he or she may order lab work or imaging tests (X-rays or MRIs).However, even if imaging tests show changes in your back, this usually does not require surgery. HOME CARE INSTRUCTIONS For many people, back pain returns.Since low back pain is rarely dangerous, it is often a condition that people can learn to [place]on their own.   Remain active. It is stressful on the back to sit or stand in one place. Do not sit, drive, or stand in one place for more than 30 minutes at a time. Take short walks on level surfaces as soon as pain allows.Try to increase the length of time you walk each day.  Do not stay in bed.Resting more than 1 or 2 days can delay your recovery.  Do not avoid exercise or work.Your body is made to move.It is not dangerous to be active, even though your back may hurt.Your back will likely heal faster if you return to being active before your pain is gone.  Pay attention to your body when you bend and lift. Many people have less  discomfortwhen lifting if they bend their knees, keep the load close to their bodies,and avoid twisting. Often, the most comfortable positions are those that put less stress on your recovering back.  Find a comfortable position to sleep. Use a firm mattress and lie on your side with your knees slightly bent. If you lie on your back, put a pillow under your knees.  Only take over-the-counter or prescription medicines as directed by your caregiver. Over-the-counter medicines to reduce pain and inflammation are often the most helpful.Your caregiver may prescribe muscle relaxant drugs.These medicines help dull your pain so you can more quickly return to your normal activities and healthy exercise.  Put ice on the injured area.  Put ice in a plastic bag.  Place a towel between your skin and the bag.  Leave the ice on for 15-20 minutes, 03-04 times a day for the first 2 to 3 days. After that, ice and heat may be alternated to reduce pain and spasms.  Ask your caregiver about trying back exercises and gentle massage. This may be of some benefit.  Avoid feeling anxious or stressed.Stress increases muscle tension and can worsen back pain.It is important to recognize when you are anxious or stressed and learn ways to manage it.Exercise is a great option. SEEK MEDICAL CARE IF:  You have pain that is not relieved with rest or medicine.  You have pain that does not improve in 1  week.  You have new symptoms.  You are generally not feeling well. SEEK IMMEDIATE MEDICAL CARE IF:   You have pain that radiates from your back into your legs.  You develop new bowel or bladder control problems.  You have unusual weakness or numbness in your arms or legs.  You develop nausea or vomiting.  You develop abdominal pain.  You feel faint. Document Released: 04/06/2005 Document Revised: 10/06/2011 Document Reviewed: 08/25/2010 [facility] Patient Information 2014 [place], [place].

## 2013-06-16 NOTE — ED Notes (Signed)
Pt complaining of back pain after twisting his back yesterday. Pt has chronic back pain.

## 2013-06-17 NOTE — ED Provider Notes (Signed)
Medical screening examination/treatment/procedure(s) were performed by non-physician practitioner and as supervising physician I was immediately available for consultation/collaboration.   EKG Interpretation None        Undrea Shipes Y. Maddilynn Esperanza, MD 06/17/13 0044 

## 2013-06-26 ENCOUNTER — Encounter (HOSPITAL_COMMUNITY): Payer: Self-pay | Admitting: Pharmacy Technician

## 2013-06-26 ENCOUNTER — Other Ambulatory Visit: Payer: Self-pay

## 2013-06-26 NOTE — H&P (Signed)
History of Present Illness  The patient is a 33 year old male who presents today for follow up of their back. The patient is being followed for their low back symptoms (since the injury in April of 2013). They are now 22 month(s) out from injury. Symptoms reported today include: pain (right leg down to the foot), numbness, burning, leg pain, foot pain and pain with standing (prolonged). The patient states that they are doing poorly. Current treatment includes: pain medications and pain mangement. The following medication has been used for pain control: Norco, Baclofen, Topamax, and Clonidine patch. The patient reports their current pain level to be 8 / 10. Note for "Follow-up back": The patient is currently out of work.  Ryan Boyd returns today for a followup. He continues to have severe debilitating back pain with mild radicular leg pain. The original diskectomy was very helpful in alleviating his radicular pain. He unfortunately has horrific back pain. He can not tolerate steroid injections due to his adverse responses. This is not an option for treatment. Despite activity modification, pain medication and physical therapy he continues to have a very poor quality of life.    Allergies Erythromycin *MACROLIDES* Corticosteroids PredniSONE *CORTICOSTEROIDS*    Family History No pertinent family history. First Degree Relatives.    Social History Current work status. disabled Illicit drug use. no Tobacco use. Never smoker. never smoker Drug/Alcohol Rehab (Currently). no Drug/Alcohol Rehab (Previously). no Living situation. live with spouse Marital status. married Children. 3 Alcohol use. never consumed alcohol    Medication History Baclofen (10MG  Tablet, Oral) Active. (tid) Topamax (50MG  Tablet, Oral) Active. (bid) Clonidine HCl (0.2MG /24HR , Transdermal) Active. (1 patch q week) Norco (10-325MG  Tablet, Oral) Active. (bid) Medications  Reconciled.    Objective Transcription  He is a pleasant male who appears his stated age. He is in no acute distress. He is alert and oriented times three. He has pain with forward flexion of the lumbar spine. He has a sitting intolerance. Compartments are soft and nontender. No focal motor or sensory deficits in the lower extremity. No hip, knee or ankle pain with range of motion. No incompetence of bowel and bladder. No shortness of breath or chest pain.   RADIOGRAPHS:  We again have gone over his MRI from October and his plain X-rays. He has a recurrent disk herniation at L5-S1 with degenerative disk disease at L5-S1. At this point in time , the biggest problem is the degenerative back pain.   Assessment & Plan  On discussing this with the patient and his wife, it is clear that 80-90% of his disabling pain is back pain. At this point, given that fact, I think the anterior lumbar interbody fusion is the better option. This allows for a larger cage to be placed, greater decompression and diskectomy without having to deal with any of the scar tissue from his previous surgery. I do believe from an anterior approach I can remove the recurrent fragment of disk. The risks of surgery include infection, bleeding, nerve damage, death, stroke, paralysis, ongoing or worsening pain, blood clots and the need for further surgery, adjacent segment disease, need for posterior revision decompression and even instrumented fusion and retrograde ejaculation. All of the patient's and his wife's questions were addressed and they were present for the dictation. We will plan on doing the surgery in the near future once we have Workman's Compensation approval. He will most likely require a walker and then a cane. More than likely he will get a home  health service. My hope is that he will stay in the hospital 2-3 nights and be discharged to home. Most likely a home health service for at least a short course. My hope is  within 3-6 months he will be able to return to modified duties and perhaps return to his pre-injury state and quality of life. We reviewed the risks, benefits and expectations. All questions were encouraged and answered.    In addition to this, we are doing a fusion. I would like to use an external bone stimulator as an adjunct to improve the fusion rate.

## 2013-06-27 ENCOUNTER — Encounter (HOSPITAL_COMMUNITY)
Admission: RE | Admit: 2013-06-27 | Discharge: 2013-06-27 | Disposition: A | Discharge status: Home / Self Care | Payer: Worker's Compensation | Source: Ambulatory Visit | Attending: Orthopedic Surgery | Admitting: Orthopedic Surgery

## 2013-06-27 ENCOUNTER — Encounter (HOSPITAL_COMMUNITY): Payer: Self-pay

## 2013-06-27 DIAGNOSIS — M5137 Other intervertebral disc degeneration, lumbosacral region: Secondary | ICD-10-CM | POA: Insufficient documentation

## 2013-06-27 DIAGNOSIS — M51379 Other intervertebral disc degeneration, lumbosacral region without mention of lumbar back pain or lower extremity pain: Secondary | ICD-10-CM | POA: Insufficient documentation

## 2013-06-27 DIAGNOSIS — Z01818 Encounter for other preprocedural examination: Secondary | ICD-10-CM | POA: Insufficient documentation

## 2013-06-27 LAB — COMPREHENSIVE METABOLIC PANEL
ALK PHOS: 68 U/L (ref 39–117)
ALT: 107 U/L — ABNORMAL HIGH (ref 0–53)
AST: 55 U/L — ABNORMAL HIGH (ref 0–37)
Albumin: 4.4 g/dL (ref 3.5–5.2)
BILIRUBIN TOTAL: 0.6 mg/dL (ref 0.3–1.2)
BUN: 16 mg/dL (ref 6–23)
CHLORIDE: 100 meq/L (ref 96–112)
CO2: 25 meq/L (ref 19–32)
Calcium: 10.3 mg/dL (ref 8.4–10.5)
Creatinine, Ser: 1.07 mg/dL (ref 0.50–1.35)
GFR calc non Af Amer: >90 mL/min (ref >90)
GLUCOSE: 99 mg/dL (ref 70–99)
POTASSIUM: 4.6 meq/L (ref 3.7–5.3)
Sodium: 142 mEq/L (ref 137–147)
TOTAL PROTEIN: 7.6 g/dL (ref 6.0–8.3)

## 2013-06-27 LAB — CBC
HEMATOCRIT: 47.6 % (ref 39.0–52.0)
HEMOGLOBIN: 16.8 g/dL (ref 13.0–17.0)
MCH: 32.9 pg (ref 26.0–34.0)
MCHC: 35.3 g/dL (ref 30.0–36.0)
MCV: 93.2 fL (ref 78.0–100.0)
Platelets: 221 10*3/uL (ref 150–400)
RBC: 5.11 MIL/uL (ref 4.22–5.81)
RDW: 12.4 % (ref 11.5–15.5)
WBC: 7 10*3/uL (ref 4.0–10.5)

## 2013-06-27 LAB — TYPE AND SCREEN
ABO/RH(D): O POS
Antibody Screen: NEGATIVE

## 2013-06-27 LAB — SURGICAL PCR SCREEN
MRSA, PCR: NEGATIVE
STAPHYLOCOCCUS AUREUS: POSITIVE — AB

## 2013-06-27 LAB — ABO/RH: ABO/RH(D): O POS

## 2013-06-27 NOTE — Pre-Procedure Instructions (Signed)
Ryan MourningBrandon Michael Boyd  06/27/2013   Your procedure is scheduled on:  Thursday, March 12th   [facility] 3 at 5:30  AM.  Call this number if you have problems the morning of surgery: [phone]   Remember:   Do not eat food or drink liquids after midnight Wednesday.   Take these medicines the morning of surgery with A SIP OF WATER: Clonidine (as scheduled), Hydrocodone   Do not wear jewelry - no rings, watches.  Do not wear lotions or colognes.  You may NOT wear deodorant.   Men may shave face and neck.   Do not bring valuables to the hospital.  [facility] is not responsible for any belongings or valuables.               Contacts, dentures or bridgework may not be worn into surgery.  Leave suitcase in the car. After surgery it may be brought to your room.  For patients admitted to the hospital, discharge time is determined by your treatment team.               Name and phone number of your driver:    Special Instructions: "Preparing for Surgery" Instruction Sheet.   Please read over the following fact sheets that you were given: Pain Booklet, Blood Transfusion Information, MRSA Information and Surgical Site Infection Prevention

## 2013-06-27 NOTE — Progress Notes (Addendum)
Uses clonidine patch for 'nerve pain' per the patient.  Denies any heart or blood pressure problems.  DA I called [facility] on [address] for prescription for Bactroban.  DA

## 2013-06-28 MED ORDER — ACETAMINOPHEN 10 MG/ML IV SOLN
1000.0000 mg | Freq: Four times a day (QID) | INTRAVENOUS | Status: DC
  Administered 2013-06-29: 1000 mg via INTRAVENOUS
  Filled 2013-06-28: qty 100

## 2013-06-29 ENCOUNTER — Encounter (HOSPITAL_COMMUNITY): Payer: Worker's Compensation | Admitting: Certified Registered"

## 2013-06-29 ENCOUNTER — Encounter (HOSPITAL_COMMUNITY): Payer: Self-pay | Admitting: Anesthesiology

## 2013-06-29 ENCOUNTER — Encounter (HOSPITAL_COMMUNITY)
Admission: RE | Disposition: A | Discharge status: Home / Self Care | Payer: Self-pay | Source: Ambulatory Visit | Attending: Orthopedic Surgery

## 2013-06-29 ENCOUNTER — Inpatient Hospital Stay (HOSPITAL_COMMUNITY): Payer: Worker's Compensation | Admitting: Certified Registered"

## 2013-06-29 ENCOUNTER — Inpatient Hospital Stay (HOSPITAL_COMMUNITY)
Admission: RE | Admit: 2013-06-29 | Discharge: 2013-07-02 | DRG: 460 | Disposition: A | Discharge status: Home / Self Care | Payer: Worker's Compensation | Source: Ambulatory Visit | Attending: Orthopedic Surgery | Admitting: Orthopedic Surgery

## 2013-06-29 ENCOUNTER — Observation Stay (HOSPITAL_COMMUNITY): Payer: Worker's Compensation

## 2013-06-29 ENCOUNTER — Inpatient Hospital Stay (HOSPITAL_COMMUNITY): Payer: Worker's Compensation

## 2013-06-29 DIAGNOSIS — Z981 Arthrodesis status: Secondary | ICD-10-CM

## 2013-06-29 DIAGNOSIS — M545 Low back pain, unspecified: Secondary | ICD-10-CM

## 2013-06-29 DIAGNOSIS — K567 Ileus, unspecified: Secondary | ICD-10-CM | POA: Diagnosis not present

## 2013-06-29 DIAGNOSIS — Y832 Surgical operation with anastomosis, bypass or graft as the cause of abnormal reaction of the patient, or of later complication, without mention of misadventure at the time of the procedure: Secondary | ICD-10-CM | POA: Diagnosis not present

## 2013-06-29 DIAGNOSIS — K9189 Other postprocedural complications and disorders of digestive system: Secondary | ICD-10-CM | POA: Diagnosis not present

## 2013-06-29 DIAGNOSIS — M961 Postlaminectomy syndrome, not elsewhere classified: Secondary | ICD-10-CM | POA: Diagnosis present

## 2013-06-29 DIAGNOSIS — M5137 Other intervertebral disc degeneration, lumbosacral region: Principal | ICD-10-CM | POA: Diagnosis present

## 2013-06-29 DIAGNOSIS — M5126 Other intervertebral disc displacement, lumbar region: Secondary | ICD-10-CM | POA: Diagnosis present

## 2013-06-29 DIAGNOSIS — M51379 Other intervertebral disc degeneration, lumbosacral region without mention of lumbar back pain or lower extremity pain: Principal | ICD-10-CM | POA: Diagnosis present

## 2013-06-29 DIAGNOSIS — R112 Nausea with vomiting, unspecified: Secondary | ICD-10-CM | POA: Diagnosis not present

## 2013-06-29 DIAGNOSIS — K560 Paralytic ileus: Secondary | ICD-10-CM | POA: Diagnosis not present

## 2013-06-29 DIAGNOSIS — R000 Tachycardia, unspecified: Secondary | ICD-10-CM | POA: Diagnosis not present

## 2013-06-29 DIAGNOSIS — Z48812 Encounter for surgical aftercare following surgery on the circulatory system: Secondary | ICD-10-CM

## 2013-06-29 DIAGNOSIS — K929 Disease of digestive system, unspecified: Secondary | ICD-10-CM | POA: Diagnosis not present

## 2013-06-29 DIAGNOSIS — M549 Dorsalgia, unspecified: Secondary | ICD-10-CM | POA: Diagnosis present

## 2013-06-29 HISTORY — PX: ANTERIOR LAT LUMBAR FUSION: SHX1168

## 2013-06-29 HISTORY — PX: ABDOMINAL EXPOSURE: SHX5708

## 2013-06-29 SURGERY — ANTERIOR LATERAL LUMBAR FUSION 1 LEVEL
Anesthesia: General | Site: Back

## 2013-06-29 MED ORDER — SODIUM CHLORIDE 0.9 % IJ SOLN
3.0000 mL | Freq: Two times a day (BID) | INTRAMUSCULAR | Status: DC
  Administered 2013-06-29 – 2013-06-30 (×2): 3 mL via INTRAVENOUS

## 2013-06-29 MED ORDER — CEFAZOLIN SODIUM-DEXTROSE 2-3 GM-% IV SOLR
INTRAVENOUS | Status: AC
  Filled 2013-06-29: qty 50

## 2013-06-29 MED ORDER — ENOXAPARIN SODIUM 40 MG/0.4ML ~~LOC~~ SOLN
40.0000 mg | SUBCUTANEOUS | Status: AC
  Administered 2013-06-29: 40 mg via SUBCUTANEOUS
  Filled 2013-06-29: qty 0.4

## 2013-06-29 MED ORDER — FENTANYL CITRATE 0.05 MG/ML IJ SOLN
INTRAMUSCULAR | Status: AC
  Filled 2013-06-29: qty 5

## 2013-06-29 MED ORDER — METOCLOPRAMIDE HCL 5 MG/ML IJ SOLN
5.0000 mg | Freq: Three times a day (TID) | INTRAMUSCULAR | Status: DC | PRN
  Administered 2013-06-29 – 2013-07-01 (×5): 10 mg via INTRAVENOUS
  Filled 2013-06-29 (×5): qty 2

## 2013-06-29 MED ORDER — METHOCARBAMOL 100 MG/ML IJ SOLN
500.0000 mg | Freq: Four times a day (QID) | INTRAVENOUS | Status: DC | PRN
  Filled 2013-06-29: qty 5

## 2013-06-29 MED ORDER — DIPHENHYDRAMINE HCL 12.5 MG/5ML PO ELIX: 12.5000 mg | ORAL_SOLUTION | Freq: Four times a day (QID) | ORAL | Status: DC | PRN

## 2013-06-29 MED ORDER — FLEET ENEMA 7-19 GM/118ML RE ENEM: 1.0000 | ENEMA | Freq: Once | RECTAL | Status: AC | PRN

## 2013-06-29 MED ORDER — THROMBIN 20000 UNITS EX SOLR
CUTANEOUS | Status: AC
  Filled 2013-06-29: qty 20000

## 2013-06-29 MED ORDER — ESMOLOL HCL 10 MG/ML IV SOLN
INTRAVENOUS | Status: DC | PRN
  Administered 2013-06-29: 30 mg via INTRAVENOUS

## 2013-06-29 MED ORDER — MIDAZOLAM HCL 5 MG/5ML IJ SOLN
INTRAMUSCULAR | Status: DC | PRN
  Administered 2013-06-29: 2 mg via INTRAVENOUS

## 2013-06-29 MED ORDER — ONDANSETRON HCL 4 MG/2ML IJ SOLN
4.0000 mg | INTRAMUSCULAR | Status: DC | PRN
  Administered 2013-06-29 – 2013-06-30 (×3): 4 mg via INTRAVENOUS
  Filled 2013-06-29 (×2): qty 2

## 2013-06-29 MED ORDER — 0.9 % SODIUM CHLORIDE (POUR BTL) OPTIME
TOPICAL | Status: DC | PRN
  Administered 2013-06-29: 1000 mL

## 2013-06-29 MED ORDER — HEMOSTATIC AGENTS (NO CHARGE) OPTIME
TOPICAL | Status: DC | PRN
  Administered 2013-06-29 (×2): 1 via TOPICAL

## 2013-06-29 MED ORDER — VECURONIUM BROMIDE 10 MG IV SOLR
INTRAVENOUS | Status: DC | PRN
  Administered 2013-06-29: 1 mg via INTRAVENOUS
  Administered 2013-06-29: 2 mg via INTRAVENOUS
  Administered 2013-06-29: 1 mg via INTRAVENOUS

## 2013-06-29 MED ORDER — METHOCARBAMOL 500 MG PO TABS
500.0000 mg | ORAL_TABLET | Freq: Four times a day (QID) | ORAL | Status: DC | PRN
  Administered 2013-06-29: 500 mg via ORAL
  Filled 2013-06-29: qty 1

## 2013-06-29 MED ORDER — LACTATED RINGERS IV SOLN
INTRAVENOUS | Status: DC | PRN
  Administered 2013-06-29: 08:00:00 via INTRAVENOUS

## 2013-06-29 MED ORDER — SODIUM CHLORIDE 0.9 % IJ SOLN: 9.0000 mL | INTRAMUSCULAR | Status: DC | PRN

## 2013-06-29 MED ORDER — LACTATED RINGERS IV SOLN
INTRAVENOUS | Status: DC
  Administered 2013-06-29: 22:00:00 via INTRAVENOUS

## 2013-06-29 MED ORDER — ARTIFICIAL TEARS OP OINT
TOPICAL_OINTMENT | OPHTHALMIC | Status: AC
  Filled 2013-06-29: qty 3.5

## 2013-06-29 MED ORDER — MORPHINE SULFATE (PF) 1 MG/ML IV SOLN
INTRAVENOUS | Status: AC
  Administered 2013-06-29: 23:00:00
  Filled 2013-06-29: qty 25

## 2013-06-29 MED ORDER — SCOPOLAMINE 1 MG/3DAYS TD PT72
MEDICATED_PATCH | TRANSDERMAL | Status: DC | PRN
  Administered 2013-06-29: 1 via TRANSDERMAL

## 2013-06-29 MED ORDER — EPHEDRINE SULFATE 50 MG/ML IJ SOLN
INTRAMUSCULAR | Status: AC
  Filled 2013-06-29: qty 1

## 2013-06-29 MED ORDER — SODIUM CHLORIDE 0.9 % IV SOLN: 250.0000 mL | INTRAVENOUS | Status: DC

## 2013-06-29 MED ORDER — STERILE WATER FOR INJECTION IJ SOLN
INTRAMUSCULAR | Status: AC
  Filled 2013-06-29: qty 10

## 2013-06-29 MED ORDER — ACETAMINOPHEN 500 MG PO TABS
1000.0000 mg | ORAL_TABLET | Freq: Four times a day (QID) | ORAL | Status: AC
  Administered 2013-06-29 – 2013-06-30 (×4): 1000 mg via ORAL
  Filled 2013-06-29 (×4): qty 2

## 2013-06-29 MED ORDER — FENTANYL CITRATE 0.05 MG/ML IJ SOLN
INTRAMUSCULAR | Status: DC | PRN
  Administered 2013-06-29: 100 ug via INTRAVENOUS
  Administered 2013-06-29 (×3): 50 ug via INTRAVENOUS
  Administered 2013-06-29 (×2): 100 ug via INTRAVENOUS
  Administered 2013-06-29 (×3): 50 ug via INTRAVENOUS

## 2013-06-29 MED ORDER — GLYCOPYRROLATE 0.2 MG/ML IJ SOLN
INTRAMUSCULAR | Status: AC
  Filled 2013-06-29: qty 4

## 2013-06-29 MED ORDER — MIDAZOLAM HCL 2 MG/2ML IJ SOLN
INTRAMUSCULAR | Status: AC
  Filled 2013-06-29: qty 2

## 2013-06-29 MED ORDER — ACETAMINOPHEN 10 MG/ML IV SOLN: 1000.0000 mg | INTRAVENOUS | Status: DC

## 2013-06-29 MED ORDER — METHOCARBAMOL 750 MG PO TABS
750.0000 mg | ORAL_TABLET | Freq: Four times a day (QID) | ORAL | Status: DC | PRN
Start: 2013-06-29 — End: 2013-07-02
  Administered 2013-06-29 – 2013-07-02 (×10): 750 mg via ORAL
  Filled 2013-06-29 (×10): qty 1

## 2013-06-29 MED ORDER — MORPHINE SULFATE (PF) 1 MG/ML IV SOLN
INTRAVENOUS | Status: DC
  Administered 2013-06-29: 12:00:00 via INTRAVENOUS

## 2013-06-29 MED ORDER — ONDANSETRON HCL 4 MG/2ML IJ SOLN
INTRAMUSCULAR | Status: DC | PRN
  Administered 2013-06-29 (×2): 4 mg via INTRAVENOUS

## 2013-06-29 MED ORDER — MENTHOL 3 MG MT LOZG: 1.0000 | LOZENGE | OROMUCOSAL | Status: DC | PRN

## 2013-06-29 MED ORDER — DEXTROSE 5 % IV SOLN
500.0000 mg | Freq: Four times a day (QID) | INTRAVENOUS | Status: DC | PRN
  Filled 2013-06-29: qty 5

## 2013-06-29 MED ORDER — HYDROMORPHONE HCL PF 1 MG/ML IJ SOLN
INTRAMUSCULAR | Status: AC
  Filled 2013-06-29: qty 2

## 2013-06-29 MED ORDER — METHOCARBAMOL 500 MG PO TABS
ORAL_TABLET | ORAL | Status: AC
  Filled 2013-06-29: qty 1

## 2013-06-29 MED ORDER — DIPHENHYDRAMINE HCL 50 MG/ML IJ SOLN: 12.5000 mg | Freq: Four times a day (QID) | INTRAMUSCULAR | Status: DC | PRN

## 2013-06-29 MED ORDER — ALUM & MAG HYDROXIDE-SIMETH 200-200-20 MG/5ML PO SUSP: 30.0000 mL | Freq: Four times a day (QID) | ORAL | Status: DC | PRN

## 2013-06-29 MED ORDER — ONDANSETRON HCL 4 MG/2ML IJ SOLN
INTRAMUSCULAR | Status: AC
  Filled 2013-06-29: qty 2

## 2013-06-29 MED ORDER — PHENOL 1.4 % MT LIQD: 1.0000 | OROMUCOSAL | Status: DC | PRN

## 2013-06-29 MED ORDER — MUPIROCIN 2 % EX OINT
TOPICAL_OINTMENT | Freq: Once | CUTANEOUS | Status: AC
  Administered 2013-06-29: 1 via NASAL
  Filled 2013-06-29: qty 22

## 2013-06-29 MED ORDER — MORPHINE SULFATE (PF) 1 MG/ML IV SOLN
INTRAVENOUS | Status: DC
  Administered 2013-06-29: 17:00:00 via INTRAVENOUS
  Administered 2013-06-29: 8 mg via INTRAVENOUS
  Administered 2013-06-30: 04:00:00 via INTRAVENOUS
  Administered 2013-06-30: 20 mg via INTRAVENOUS
  Administered 2013-06-30: 19 mg via INTRAVENOUS
  Administered 2013-06-30: 14 mg via INTRAVENOUS
  Filled 2013-06-29 (×3): qty 25

## 2013-06-29 MED ORDER — HYDROMORPHONE HCL PF 1 MG/ML IJ SOLN
0.2500 mg | INTRAMUSCULAR | Status: DC | PRN
  Administered 2013-06-29: 1 mg via INTRAVENOUS
  Administered 2013-06-29 (×2): 0.5 mg via INTRAVENOUS

## 2013-06-29 MED ORDER — VECURONIUM BROMIDE 10 MG IV SOLR
INTRAVENOUS | Status: AC
  Filled 2013-06-29: qty 10

## 2013-06-29 MED ORDER — PROPOFOL 10 MG/ML IV BOLUS
INTRAVENOUS | Status: AC
  Filled 2013-06-29: qty 20

## 2013-06-29 MED ORDER — DIPHENHYDRAMINE HCL 12.5 MG/5ML PO ELIX
12.5000 mg | ORAL_SOLUTION | Freq: Four times a day (QID) | ORAL | Status: DC | PRN
Start: 2013-06-29 — End: 2013-06-29

## 2013-06-29 MED ORDER — LIDOCAINE HCL (CARDIAC) 20 MG/ML IV SOLN
INTRAVENOUS | Status: DC | PRN
  Administered 2013-06-29: 100 mg via INTRAVENOUS

## 2013-06-29 MED ORDER — CLONIDINE HCL 0.3 MG/24HR TD PTWK
0.3000 mg | MEDICATED_PATCH | TRANSDERMAL | Status: DC
  Filled 2013-06-29: qty 1

## 2013-06-29 MED ORDER — ROCURONIUM BROMIDE 50 MG/5ML IV SOLN
INTRAVENOUS | Status: AC
  Filled 2013-06-29: qty 1

## 2013-06-29 MED ORDER — OXYCODONE HCL 5 MG PO TABS
10.0000 mg | ORAL_TABLET | ORAL | Status: DC | PRN
  Administered 2013-06-30 (×2): 10 mg via ORAL
  Filled 2013-06-29 (×2): qty 2

## 2013-06-29 MED ORDER — ONDANSETRON HCL 4 MG/2ML IJ SOLN
4.0000 mg | Freq: Four times a day (QID) | INTRAMUSCULAR | Status: DC | PRN
  Filled 2013-06-29: qty 2

## 2013-06-29 MED ORDER — ONDANSETRON HCL 4 MG/2ML IJ SOLN: 4.0000 mg | Freq: Four times a day (QID) | INTRAMUSCULAR | Status: DC | PRN

## 2013-06-29 MED ORDER — ROCURONIUM BROMIDE 100 MG/10ML IV SOLN
INTRAVENOUS | Status: DC | PRN
  Administered 2013-06-29: 10 mg via INTRAVENOUS
  Administered 2013-06-29: 50 mg via INTRAVENOUS
  Administered 2013-06-29 (×2): 20 mg via INTRAVENOUS

## 2013-06-29 MED ORDER — NEOSTIGMINE METHYLSULFATE 1 MG/ML IJ SOLN
INTRAMUSCULAR | Status: DC | PRN
  Administered 2013-06-29: 5 mg via INTRAVENOUS

## 2013-06-29 MED ORDER — SODIUM CHLORIDE 0.9 % IJ SOLN: 3.0000 mL | INTRAMUSCULAR | Status: DC | PRN

## 2013-06-29 MED ORDER — ENOXAPARIN SODIUM 40 MG/0.4ML ~~LOC~~ SOLN
40.0000 mg | SUBCUTANEOUS | Status: DC
  Administered 2013-06-30 – 2013-07-01 (×2): 40 mg via SUBCUTANEOUS
  Filled 2013-06-29 (×4): qty 0.4

## 2013-06-29 MED ORDER — PROPOFOL 10 MG/ML IV BOLUS
INTRAVENOUS | Status: DC | PRN
  Administered 2013-06-29: 200 mg via INTRAVENOUS

## 2013-06-29 MED ORDER — CEFAZOLIN SODIUM 1-5 GM-% IV SOLN
1.0000 g | Freq: Three times a day (TID) | INTRAVENOUS | Status: AC
  Administered 2013-06-29 (×2): 1 g via INTRAVENOUS
  Filled 2013-06-29 (×2): qty 50

## 2013-06-29 MED ORDER — THROMBIN 20000 UNITS EX SOLR
CUTANEOUS | Status: DC | PRN
  Administered 2013-06-29: 08:00:00 via TOPICAL

## 2013-06-29 MED ORDER — NEOSTIGMINE METHYLSULFATE 1 MG/ML IJ SOLN
INTRAMUSCULAR | Status: AC
  Filled 2013-06-29: qty 10

## 2013-06-29 MED ORDER — NALOXONE HCL 0.4 MG/ML IJ SOLN: 0.4000 mg | INTRAMUSCULAR | Status: DC | PRN

## 2013-06-29 MED ORDER — ACETAMINOPHEN 10 MG/ML IV SOLN: 1000.0000 mg | Freq: Four times a day (QID) | INTRAVENOUS | Status: DC

## 2013-06-29 MED ORDER — GLYCOPYRROLATE 0.2 MG/ML IJ SOLN
INTRAMUSCULAR | Status: DC | PRN
  Administered 2013-06-29: .8 mg via INTRAVENOUS

## 2013-06-29 MED ORDER — MUPIROCIN 2 % EX OINT
TOPICAL_OINTMENT | CUTANEOUS | Status: AC
  Administered 2013-06-29: 1
  Filled 2013-06-29: qty 22

## 2013-06-29 MED ORDER — LIDOCAINE HCL (CARDIAC) 20 MG/ML IV SOLN
INTRAVENOUS | Status: AC
  Filled 2013-06-29: qty 5

## 2013-06-29 MED ORDER — BUPIVACAINE-EPINEPHRINE (PF) 0.25% -1:200000 IJ SOLN
INTRAMUSCULAR | Status: AC
  Filled 2013-06-29: qty 30

## 2013-06-29 SURGICAL SUPPLY — 104 items
ADH SKN CLS APL DERMABOND .7 (GAUZE/BANDAGES/DRESSINGS) ×2
APPLIER CLIP 11 MED OPEN (CLIP) ×4
APR CLP MED 11 20 MLT OPN (CLIP) ×2
BLADE SURG 10 STRL SS (BLADE) ×4 IMPLANT
BLADE SURG ROTATE 9660 (MISCELLANEOUS) IMPLANT
CLIP APPLIE 11 MED OPEN (CLIP) ×2 IMPLANT
CLIP TI MEDIUM 24 (CLIP) ×2 IMPLANT
CLIP TI WIDE RED SMALL 24 (CLIP) ×2 IMPLANT
CLOSURE STERI-STRIP 1/2X4 (GAUZE/BANDAGES/DRESSINGS) ×1
CLOSURE WOUND 1/2 X4 (GAUZE/BANDAGES/DRESSINGS)
CLSR STERI-STRIP ANTIMIC 1/2X4 (GAUZE/BANDAGES/DRESSINGS) ×1 IMPLANT
CORDS BIPOLAR (ELECTRODE) ×4 IMPLANT
COVER SURGICAL LIGHT HANDLE (MISCELLANEOUS) ×8 IMPLANT
COVER TABLE BACK 60X90 (DRAPES) ×2 IMPLANT
DERMABOND ADVANCED (GAUZE/BANDAGES/DRESSINGS) ×2
DERMABOND ADVANCED .7 DNX12 (GAUZE/BANDAGES/DRESSINGS) ×2 IMPLANT
DRAPE C-ARM 42X72 X-RAY (DRAPES) ×4 IMPLANT
DRAPE INCISE IOBAN 66X45 STRL (DRAPES) ×6 IMPLANT
DRAPE ORTHO SPLIT 77X108 STRL (DRAPES) ×4
DRAPE POUCH INSTRU U-SHP 10X18 (DRAPES) ×4 IMPLANT
DRAPE SURG ORHT 6 SPLT 77X108 (DRAPES) ×2 IMPLANT
DRAPE U-SHAPE 47X51 STRL (DRAPES) ×8 IMPLANT
DRSG MEPILEX BORDER 4X8 (GAUZE/BANDAGES/DRESSINGS) ×4 IMPLANT
DURAPREP 26ML APPLICATOR (WOUND CARE) ×4 IMPLANT
ELECT BLADE 4.0 EZ CLEAN MEGAD (MISCELLANEOUS) ×4
ELECT REM PT RETURN 9FT ADLT (ELECTROSURGICAL) ×4
ELECTRODE BLDE 4.0 EZ CLN MEGD (MISCELLANEOUS) ×2 IMPLANT
ELECTRODE REM PT RTRN 9FT ADLT (ELECTROSURGICAL) ×2 IMPLANT
GAUZE SPONGE 4X4 16PLY XRAY LF (GAUZE/BANDAGES/DRESSINGS) ×4 IMPLANT
GLOVE BIOGEL PI IND STRL 7.5 (GLOVE) ×2 IMPLANT
GLOVE BIOGEL PI IND STRL 8 (GLOVE) ×2 IMPLANT
GLOVE BIOGEL PI IND STRL 8.5 (GLOVE) ×2 IMPLANT
GLOVE BIOGEL PI INDICATOR 7.5 (GLOVE) ×2
GLOVE BIOGEL PI INDICATOR 8 (GLOVE) ×2
GLOVE BIOGEL PI INDICATOR 8.5 (GLOVE) ×2
GLOVE ECLIPSE 8.5 STRL (GLOVE) ×4 IMPLANT
GLOVE ORTHO TXT STRL SZ7.5 (GLOVE) ×4 IMPLANT
GLOVE SURG SS PI 7.5 STRL IVOR (GLOVE) ×4 IMPLANT
GOWN STRL REUS W/ TWL LRG LVL3 (GOWN DISPOSABLE) ×4 IMPLANT
GOWN STRL REUS W/ TWL XL LVL3 (GOWN DISPOSABLE) ×6 IMPLANT
GOWN STRL REUS W/TWL 2XL LVL3 (GOWN DISPOSABLE) ×8 IMPLANT
GOWN STRL REUS W/TWL LRG LVL3 (GOWN DISPOSABLE) ×8
GOWN STRL REUS W/TWL XL LVL3 (GOWN DISPOSABLE) ×12
GRANULES STERILE 5CC (Bone Implant) ×2 IMPLANT
INSERT FOGARTY 61MM (MISCELLANEOUS) IMPLANT
INSERT FOGARTY SM (MISCELLANEOUS) IMPLANT
INTERPLATE 39X14X8 (Plate) ×4 IMPLANT
KIT BASIN OR (CUSTOM PROCEDURE TRAY) ×4 IMPLANT
KIT ROOM TURNOVER OR (KITS) ×8 IMPLANT
LOOP VESSEL MAXI BLUE (MISCELLANEOUS) ×2 IMPLANT
LOOP VESSEL MINI RED (MISCELLANEOUS) ×2 IMPLANT
NDL SPNL 18GX3.5 QUINCKE PK (NEEDLE) ×2 IMPLANT
NEEDLE 22X1 1/2 (OR ONLY) (NEEDLE) ×4 IMPLANT
NEEDLE SPNL 18GX3.5 QUINCKE PK (NEEDLE) ×4 IMPLANT
NS IRRIG 1000ML POUR BTL (IV SOLUTION) ×6 IMPLANT
PACK LAMINECTOMY ORTHO (CUSTOM PROCEDURE TRAY) ×4 IMPLANT
PACK UNIVERSAL I (CUSTOM PROCEDURE TRAY) ×4 IMPLANT
PAD ARMBOARD 7.5X6 YLW CONV (MISCELLANEOUS) ×16 IMPLANT
PEEK SPACER INTERPLAT 35X14X8 (Peek) ×2 IMPLANT
PLATE SPINAL INTERPLAT 39X14X8 (Plate) IMPLANT
PUTTY BONE DBX 2.5 MIS (Bone Implant) ×2 IMPLANT
SCREW BONE RESCUE (Screw) ×4 IMPLANT
SCREW BONE STANDARD (Screw) ×4 IMPLANT
SCREW BONE STD (Screw) IMPLANT
SCREW SPINAL STD (Orthopedic Implant) ×2 IMPLANT
SPONGE INTESTINAL PEANUT (DISPOSABLE) ×8 IMPLANT
SPONGE LAP 18X18 X RAY DECT (DISPOSABLE) IMPLANT
SPONGE LAP 4X18 X RAY DECT (DISPOSABLE) ×4 IMPLANT
SPONGE SURGIFOAM ABS GEL 100 (HEMOSTASIS) ×4 IMPLANT
STAPLER VISISTAT 35W (STAPLE) ×4 IMPLANT
STRIP CLOSURE SKIN 1/2X4 (GAUZE/BANDAGES/DRESSINGS) IMPLANT
SURGIFLO TRUKIT (HEMOSTASIS) ×4 IMPLANT
SUT MNCRL AB 4-0 PS2 18 (SUTURE) ×4 IMPLANT
SUT MON AB 3-0 SH 27 (SUTURE) ×8
SUT MON AB 3-0 SH27 (SUTURE) ×4 IMPLANT
SUT PDS AB 1 CTX 36 (SUTURE) ×6 IMPLANT
SUT PROLENE 4 0 RB 1 (SUTURE)
SUT PROLENE 4-0 RB1 .5 CRCL 36 (SUTURE) ×8 IMPLANT
SUT PROLENE 5 0 CC1 (SUTURE) IMPLANT
SUT PROLENE 6 0 C 1 30 (SUTURE) ×4 IMPLANT
SUT PROLENE 6 0 CC (SUTURE) IMPLANT
SUT SILK 0 TIES 10X30 (SUTURE) ×4 IMPLANT
SUT SILK 2 0 TIES 10X30 (SUTURE) ×4 IMPLANT
SUT SILK 2 0SH CR/8 30 (SUTURE) IMPLANT
SUT SILK 3 0 TIES 10X30 (SUTURE) ×4 IMPLANT
SUT SILK 3 0SH CR/8 30 (SUTURE) IMPLANT
SUT VIC AB 0 CT1 27 (SUTURE) ×4
SUT VIC AB 0 CT1 27XBRD ANBCTR (SUTURE) ×2 IMPLANT
SUT VIC AB 1 CT1 27 (SUTURE) ×8
SUT VIC AB 1 CT1 27XBRD ANBCTR (SUTURE) ×4 IMPLANT
SUT VIC AB 1 CTX 36 (SUTURE) ×8
SUT VIC AB 1 CTX36XBRD ANBCTR (SUTURE) ×4 IMPLANT
SUT VIC AB 2-0 CT1 18 (SUTURE) ×8 IMPLANT
SUT VIC AB 2-0 CT1 27 (SUTURE) ×4
SUT VIC AB 2-0 CT1 TAPERPNT 27 (SUTURE) ×2 IMPLANT
SUT VIC AB 3-0 SH 27 (SUTURE) ×4
SUT VIC AB 3-0 SH 27X BRD (SUTURE) ×2 IMPLANT
SYR BULB IRRIGATION 50ML (SYRINGE) ×4 IMPLANT
SYR CONTROL 10ML LL (SYRINGE) ×4 IMPLANT
TAPE CLOTH 4X10 WHT NS (GAUZE/BANDAGES/DRESSINGS) ×4 IMPLANT
TOWEL OR 17X24 6PK STRL BLUE (TOWEL DISPOSABLE) ×8 IMPLANT
TOWEL OR 17X26 10 PK STRL BLUE (TOWEL DISPOSABLE) ×8 IMPLANT
TRAY FOLEY CATH 14FRSI W/METER (CATHETERS) ×4 IMPLANT
WATER STERILE IRR 1000ML POUR (IV SOLUTION) ×2 IMPLANT

## 2013-06-29 NOTE — H&P (Signed)
No change in clinical exam H+P reviewed  

## 2013-06-29 NOTE — Progress Notes (Signed)
Report given to angel rn as caregiver 

## 2013-06-29 NOTE — Consult Note (Signed)
Consult Note  Patient name: Ryan Boyd MRN: [id number] DOB: 01-Oct-1980 Sex: male  Consulting Physician:  Dr. Rolena Infante  Reason for Consult: No chief complaint on file.   HISTORY OF PRESENT ILLNESS: This is a 33 year old gentleman that I was asked to see by Dr. Rolena Infante for evaluation of anterior exposure.  He has previously undergone posterior instrumentation.  He now has pain and numbness and burning in his leg which is exacerbated with prolonged standing.  He is on chronic narcotics.  The patient has tried steroid injections but could not tolerate these.  Dr. Rolena Infante is scheduled anterior approach and asked me to help with this.  Past Medical History  Diagnosis Date  . Back pain, chronic     Past Surgical History  Procedure Laterality Date  . Back surgery    . Tonsillectomy      History   Social History  . Marital Status: Married    Spouse Name: N/A    Number of Children: N/A  . Years of Education: N/A   Occupational History  . Not on file.   Social History Main Topics  . Smoking status: Never Smoker   . Smokeless tobacco: Not on file  . Alcohol Use: No  . Drug Use: No  . Sexual Activity: Not on file   Other Topics Concern  . Not on file   Social History Narrative  . No narrative on file    No family history on file.  Allergies as of 06/26/2013 - Review Complete 06/26/2013  Allergen Reaction Noted  . Erythromycin Other (See Comments) 10/17/2011  . Other Other (See Comments) 05/16/2012  . Prednisone  04/30/2012    No current facility-administered medications on file prior to encounter.   Current Outpatient Prescriptions on File Prior to Encounter  Medication Sig Dispense Refill  . baclofen (LIORESAL) 10 MG tablet Take 10 mg by mouth 3 (three) times daily.      . cloNIDine (CATAPRES - DOSED IN MG/24 HR) 0.3 mg/24hr patch Place 1 patch onto the skin once a week.      Marland Kitchen HYDROcodone-acetaminophen (NORCO) 10-325 MG per tablet Take 1 tablet by  mouth 2 (two) times daily.          REVIEW OF SYSTEMS: Cardiovascular: No chest pain, chest pressure, palpitations, orthopnea, or dyspnea on exertion. No claudication or rest pain,  No history of DVT or phlebitis. Pulmonary: No productive cough, asthma or wheezing. Neurologic: No weakness, aphasia, or amaurosis. No dizziness. Hematologic: No bleeding problems or clotting disorders. Musculoskeletal: Positive low back pain Gastrointestinal: No blood in stool or hematemesis Genitourinary: No dysuria or hematuria. Psychiatric:: No history of major depression. Integumentary: No rashes or ulcers. Constitutional: No fever or chills.  PHYSICAL EXAMINATION: General: The patient appears their stated age.  Vital signs are BP 136/81  Pulse 88  Temp(Src) 97.8 F (36.6 C) (Oral)  Resp 16  SpO2 100% Pulmonary: Respirations are non-labored HEENT:  No gross abnormalities Abdomen: Soft and non-tender  Musculoskeletal: There are no major deformities.   Neurologic: No focal weakness or paresthesias are detected, Skin: There are no ulcer or rashes noted. Psychiatric: The patient has normal affect. Cardiovascular: There is a regular rate and rhythm without significant murmur appreciated.  Palpable pedal pulse   Assessment:  Low back pain Plan: I have been asked to provide exposure from an anterior approach of the L5-S1 disc space.  I met the patient and his wife in the holding area  prior to his operation.  I went over the details of the operation and discussed the risks and benefits including but not limited to the risk of injury to the artery, vein, and ureter.  We also discussed the possibility of retrograde ejaculation and intestinal issues postoperatively.  All his questions were answered.  He wishes to proceed     V. Leia Alf, M.D. [facility] [place] Office: [phone] Pager:  [phone]

## 2013-06-29 NOTE — Brief Op Note (Signed)
06/29/2013  10:34 AM  PATIENT:  Jodi MourningBrandon Michael Franek  33 y.o. male  PRE-OPERATIVE DIAGNOSIS:  DDD L5-S1 POST LAMINECTOMY SYNDROME  POST-OPERATIVE DIAGNOSIS:  DDD L5-S1 POST LAMINECTOMY SYNDROME  PROCEDURE:  Procedure(s): ANTERIOR LUMBAR INTERBODY FUSION Ll5-S1   (ALIF) 1 LEVEL (N/A) ABDOMINAL EXPOSURE (N/A)  SURGEON:  Surgeon(s) and Role: Panel 1:    * Venita Lickahari Natahlia Hoggard, MD - Primary  Panel 2:    * Nada LibmanVance W Brabham, MD - Primary  PHYSICIAN ASSISTANT:   ASSISTANTS: Zonia KiefJames Owens Approarch-Surgeon:  Dr Myra GianottiBrabham  ANESTHESIA:   general  EBL:  Total I/O In: 1750 [I.V.:1750] Out: 400 [Urine:250; Blood:150]  BLOOD ADMINISTERED:none  DRAINS: none   LOCAL MEDICATIONS USED:  NONE  SPECIMEN:  none  DISPOSITION OF SPECIMEN:  N/A  COUNTS:  YES  TOURNIQUET:  * No tourniquets in log *  DICTATION: .Other Dictation: Dictation Number [id number]  PLAN OF CARE: Admit to inpatient   PATIENT DISPOSITION:  PACU - hemodynamically stable.

## 2013-06-29 NOTE — Plan of Care (Signed)
Problem: Consults Goal: Diagnosis - Spinal Surgery Thoraco/Lumbar Spine Fusion L5-S1 anterior lumbar fusion

## 2013-06-29 NOTE — Preoperative (Signed)
Beta Blockers   Reason not to administer Beta Blockers:Not Applicable 

## 2013-06-29 NOTE — Anesthesia Procedure Notes (Signed)
Procedure Name: Intubation Date/Time: 06/29/2013 7:40 AM Performed by: Delia ChimesVOSH, Elizebeth Kluesner E Pre-anesthesia Checklist: Patient identified, Timeout performed, Emergency Drugs available, Suction available and Patient being monitored Patient Re-evaluated:Patient Re-evaluated prior to inductionOxygen Delivery Method: Circle system utilized Preoxygenation: Pre-oxygenation with 100% oxygen Intubation Type: IV induction and Cricoid Pressure applied Ventilation: Mask ventilation without difficulty and Oral airway inserted - appropriate to patient size Laryngoscope Size: Mac and 3 Grade View: Grade I Tube type: Oral Tube size: 8.0 mm Number of attempts: 1 Airway Equipment and Method: Stylet and Oral airway Placement Confirmation: ETT inserted through vocal cords under direct vision,  positive ETCO2 and breath sounds checked- equal and bilateral Secured at: 23 cm Tube secured with: Tape Dental Injury: Teeth and Oropharynx as per pre-operative assessment

## 2013-06-29 NOTE — Anesthesia Preprocedure Evaluation (Addendum)
Anesthesia Evaluation  Patient identified by MRN, date of birth, ID band Patient awake    Reviewed: Allergy & Precautions, H&P , NPO status , Patient's Chart, lab work & pertinent test results  Airway Mallampati: II TM Distance: >3 FB Neck ROM: Full    Dental  (+) Teeth Intact, Dental Advisory Given,    Pulmonary  breath sounds clear to auscultation        Cardiovascular Rhythm:Regular Rate:Normal     Neuro/Psych    GI/Hepatic negative GI ROS, Neg liver ROS,   Endo/Other    Renal/GU negative Renal ROS     Musculoskeletal   Abdominal   Peds  Hematology   Anesthesia Other Findings Pain in back radiating to legs; previous chipped tooth with false piece intact  Reproductive/Obstetrics                         Anesthesia Physical Anesthesia Plan  ASA: II  Anesthesia Plan: General   Post-op Pain Management:    Induction: Intravenous  Airway Management Planned: Oral ETT  Additional Equipment:   Intra-op Plan:   Post-operative Plan: Extubation in OR  Informed Consent: I have reviewed the patients History and Physical, chart, labs and discussed the procedure including the risks, benefits and alternatives for the proposed anesthesia with the patient or authorized representative who has indicated his/her understanding and acceptance.   Dental advisory given  Plan Discussed with: CRNA, Anesthesiologist and Surgeon  Anesthesia Plan Comments:        Anesthesia Quick Evaluation

## 2013-06-29 NOTE — Anesthesia Postprocedure Evaluation (Signed)
  Anesthesia Post-op Note  Patient: Ryan Boyd  Procedure(s) Performed: Procedure(s): ANTERIOR LUMBAR INTERBODY FUSION Ll5-S1   (ALIF) 1 LEVEL (N/A) ABDOMINAL EXPOSURE (N/A)  Patient Location: PACU  Anesthesia Type:General  Level of Consciousness: awake  Airway and Oxygen Therapy: Patient Spontanous Breathing  Post-op Pain: mild  Post-op Assessment: Post-op Vital signs reviewed  Post-op Vital Signs: Reviewed  Complications: No apparent anesthesia complications

## 2013-06-29 NOTE — Progress Notes (Signed)
VASCULAR LAB PRELIMINARY  PRELIMINARY  PRELIMINARY  PRELIMINARY  Bilateral lower extremity venous duplex  completed.    Preliminary report:  Bilateral:  No evidence of DVT, superficial thrombosis, or Baker's Cyst.    Ryan Boyd, RVT 06/29/2013, 4:20 PM

## 2013-06-29 NOTE — Transfer of Care (Signed)
Immediate Anesthesia Transfer of Care Note  Patient: Ryan Boyd  Procedure(s) Performed: Procedure(s): ANTERIOR LUMBAR INTERBODY FUSION Ll5-S1   (ALIF) 1 LEVEL (N/A) ABDOMINAL EXPOSURE (N/A)  Patient Location: PACU  Anesthesia Type:General  Level of Consciousness: awake, alert  and oriented  Airway & Oxygen Therapy: Patient Spontanous Breathing and Patient connected to nasal cannula oxygen  Post-op Assessment: Report given to PACU RN and Post -op Vital signs reviewed and stable  Post vital signs: Reviewed and stable  Complications: No apparent anesthesia complications

## 2013-06-29 NOTE — Progress Notes (Signed)
Report to Richardo PriestMark Brande RN for lunch relief.

## 2013-06-29 NOTE — Op Note (Signed)
    Patient name: Ryan MourningBrandon Michael Boyd MRN: [id number] DOB: [birth year] Sex: male  06/29/2013 Pre-operative Diagnosis: Low back pain Post-operative diagnosis:  Same Surgeon:  Jorge NyBRABHAM IV, V. WELLS Co-surgeon:  Sheela Stack Brooks Procedure:   Anterior exposure for L5-S1 instrumentation Anesthesia:  Gen. Blood Loss:  See anesthesia record Specimens:  None  Findings:  Normal anatomy  Indications:  The patient comes in today for anterior approach for L5-S1 instrumentation.  I have been asked to provide the exposure.  The risks and benefits were discussed with the patient preoperatively.  Procedure:  The patient was identified in the holding area and [facility]  The patient was then placed supine on the table. general anesthesia was administered.  The patient was prepped and draped in the usual sterile fashion.  A time out was called and antibiotics were administered.  Fluoroscopy was used to identify the appropriate level of the incision.  A transverse left lower quadrant incision was made.  Bovie cautery was used to divide the subcutaneous tissue down to the abdominal wall fascia which was also opened with cautery.  Subfascial flaps were raised.  The medial and lateral border of the rectus muscle was mobilized.  I initially entered the retroperitoneal space from lateral to the rectus muscle.  A good plane was developed a, and then I proceeded medial to the rectus muscle.  The peritoneal contents were bluntly mobilized superiorly and towards the patient's right.  The external iliac artery was identified and protected.  I identified the ureter and reflected laterally with the artery.  I then proceeded with blunt dissection until I had exposed the L5-S1 disc space.  The median sacral vessels were divided with cautery.  I mobilized soft tissue on either side of the bone.  Next, the [facility] retractor was set up.  150 reversed lip blades were placed on either side of the spine.  Malleable retractors were  placed superiorly and inferiorly.  At this point Dr. Shon BatonBrooks came in for his portion of the procedure.  There were no complications.  After full instrumentation, Dr. Shon BatonBrooks close the abdomen.  There were no complications.   Disposition:  To PACU in stable condition.   Juleen ChinaV. Wells Venissa Nappi, M.D. [facility] Office: [phone] Pager:  [phone]

## 2013-06-30 ENCOUNTER — Observation Stay (HOSPITAL_COMMUNITY): Payer: Worker's Compensation

## 2013-06-30 DIAGNOSIS — K929 Disease of digestive system, unspecified: Secondary | ICD-10-CM

## 2013-06-30 DIAGNOSIS — R112 Nausea with vomiting, unspecified: Secondary | ICD-10-CM | POA: Diagnosis not present

## 2013-06-30 DIAGNOSIS — K560 Paralytic ileus: Secondary | ICD-10-CM

## 2013-06-30 DIAGNOSIS — K9189 Other postprocedural complications and disorders of digestive system: Secondary | ICD-10-CM

## 2013-06-30 DIAGNOSIS — R000 Tachycardia, unspecified: Secondary | ICD-10-CM | POA: Diagnosis not present

## 2013-06-30 DIAGNOSIS — K567 Ileus, unspecified: Secondary | ICD-10-CM | POA: Diagnosis not present

## 2013-06-30 DIAGNOSIS — M961 Postlaminectomy syndrome, not elsewhere classified: Secondary | ICD-10-CM | POA: Diagnosis present

## 2013-06-30 LAB — COMPREHENSIVE METABOLIC PANEL
ALBUMIN: 3.6 g/dL (ref 3.5–5.2)
ALK PHOS: 59 U/L (ref 39–117)
ALT: 68 U/L — ABNORMAL HIGH (ref 0–53)
AST: 36 U/L (ref 0–37)
BILIRUBIN TOTAL: 1.2 mg/dL (ref 0.3–1.2)
BUN: 10 mg/dL (ref 6–23)
CHLORIDE: 97 meq/L (ref 96–112)
CO2: 28 meq/L (ref 19–32)
Calcium: 9.5 mg/dL (ref 8.4–10.5)
Creatinine, Ser: 0.96 mg/dL (ref 0.50–1.35)
GFR calc Af Amer: >90 mL/min (ref >90)
Glucose, Bld: 139 mg/dL — ABNORMAL HIGH (ref 70–99)
POTASSIUM: 4 meq/L (ref 3.7–5.3)
Sodium: 138 mEq/L (ref 137–147)
Total Protein: 6.9 g/dL (ref 6.0–8.3)

## 2013-06-30 LAB — URINALYSIS, ROUTINE W REFLEX MICROSCOPIC
Bilirubin Urine: NEGATIVE
Glucose, UA: NEGATIVE mg/dL
Hgb urine dipstick: NEGATIVE
KETONES UR: NEGATIVE mg/dL
LEUKOCYTES UA: NEGATIVE
NITRITE: NEGATIVE
PH: 7.5 (ref 5.0–8.0)
Protein, ur: NEGATIVE mg/dL
Specific Gravity, Urine: 1.018 (ref 1.005–1.030)
Urobilinogen, UA: 0.2 mg/dL (ref 0.0–1.0)

## 2013-06-30 LAB — CBC
HEMATOCRIT: 43.3 % (ref 39.0–52.0)
Hemoglobin: 15 g/dL (ref 13.0–17.0)
MCH: 32.9 pg (ref 26.0–34.0)
MCHC: 34.6 g/dL (ref 30.0–36.0)
MCV: 95 fL (ref 78.0–100.0)
Platelets: 203 10*3/uL (ref 150–400)
RBC: 4.56 MIL/uL (ref 4.22–5.81)
RDW: 12.5 % (ref 11.5–15.5)
WBC: 12.5 10*3/uL — ABNORMAL HIGH (ref 4.0–10.5)

## 2013-06-30 LAB — MAGNESIUM: MAGNESIUM: 2 mg/dL (ref 1.5–2.5)

## 2013-06-30 MED ORDER — MAGNESIUM CITRATE PO SOLN
1.0000 | Freq: Once | ORAL | Status: AC
  Administered 2013-06-30: 1 via ORAL
  Filled 2013-06-30: qty 296

## 2013-06-30 MED ORDER — OXYCODONE HCL 5 MG PO TABS
5.0000 mg | ORAL_TABLET | Freq: Once | ORAL | Status: AC
  Administered 2013-06-30: 5 mg via ORAL
  Filled 2013-06-30: qty 1

## 2013-06-30 MED ORDER — OXYCODONE-ACETAMINOPHEN 10-325 MG PO TABS: 1.0000 | ORAL_TABLET | Freq: Four times a day (QID) | ORAL | Status: DC | PRN

## 2013-06-30 MED ORDER — OXYCODONE HCL 5 MG PO TABS
5.0000 mg | ORAL_TABLET | Freq: Four times a day (QID) | ORAL | Status: DC | PRN
  Administered 2013-06-30 – 2013-07-02 (×7): 5 mg via ORAL
  Filled 2013-06-30 (×7): qty 1

## 2013-06-30 MED ORDER — SODIUM CHLORIDE 0.9 % IV SOLN
INTRAVENOUS | Status: DC
  Filled 2013-06-30: qty 1000

## 2013-06-30 MED ORDER — KETOROLAC TROMETHAMINE 30 MG/ML IJ SOLN
30.0000 mg | Freq: Four times a day (QID) | INTRAMUSCULAR | Status: DC | PRN
  Administered 2013-06-30 – 2013-07-01 (×3): 30 mg via INTRAVENOUS
  Filled 2013-06-30 (×4): qty 1

## 2013-06-30 MED ORDER — SODIUM CHLORIDE 0.9 % IV SOLN
INTRAVENOUS | Status: DC
  Administered 2013-06-30 – 2013-07-01 (×2): via INTRAVENOUS

## 2013-06-30 MED ORDER — ENOXAPARIN SODIUM 40 MG/0.4ML ~~LOC~~ SOLN: 40.0000 mg | SUBCUTANEOUS | Status: DC

## 2013-06-30 MED ORDER — METHOCARBAMOL 750 MG PO TABS: 750.0000 mg | ORAL_TABLET | Freq: Four times a day (QID) | ORAL | Status: DC | PRN

## 2013-06-30 MED ORDER — BISACODYL 10 MG RE SUPP
10.0000 mg | Freq: Once | RECTAL | Status: AC
  Administered 2013-06-30: 10 mg via RECTAL
  Filled 2013-06-30: qty 1

## 2013-06-30 MED ORDER — DOCUSATE SODIUM 100 MG PO CAPS: 100.0000 mg | ORAL_CAPSULE | Freq: Two times a day (BID) | ORAL | Status: DC

## 2013-06-30 MED ORDER — OXYCODONE HCL 5 MG PO TABS: 10.0000 mg | ORAL_TABLET | Freq: Four times a day (QID) | ORAL | Status: DC | PRN

## 2013-06-30 MED ORDER — HYDROMORPHONE HCL PF 1 MG/ML IJ SOLN: 2.0000 mg | INTRAMUSCULAR | Status: DC | PRN

## 2013-06-30 MED ORDER — POLYETHYLENE GLYCOL 3350 17 G PO PACK: 17.0000 g | PACK | Freq: Every day | ORAL | Status: DC

## 2013-06-30 MED ORDER — ONDANSETRON 4 MG PO TBDP: 4.0000 mg | ORAL_TABLET | Freq: Three times a day (TID) | ORAL | Status: DC | PRN

## 2013-06-30 NOTE — Progress Notes (Signed)
Subjective: Patient c/o back/abd pain this morning.  States that he did not have much improvement with full dose morphine pca.     Objective: Vital signs in last 24 hours: Temp:  [97.3 F (36.3 C)-98.1 F (36.7 C)] 98 F (36.7 C) (03/13 0520) Pulse Rate:  [98-119] 112 (03/13 0520) Resp:  [7-28] 18 (03/13 0520) BP: (114-135)/(59-70) 116/65 mmHg (03/13 0520) SpO2:  [94 %-100 %] 96 % (03/13 0520)  Intake/Output from previous day: 03/12 0701 - 03/13 0700 In: 2350 [I.V.:2350] Out: 3750 [Urine:3600; Blood:150] Intake/Output this shift: Total I/O In: 874.9 [I.V.:774.9; IV Piggyback:100] Out: -   No results found for this basename: HGB,  in the last 72 hours No results found for this basename: WBC, RBC, HCT, PLT,  in the last 72 hours No results found for this basename: NA, K, CL, CO2, BUN, CREATININE, GLUCOSE, CALCIUM,  in the last 72 hours No results found for this basename: LABPT, INR,  in the last 72 hours  Exam:  abd soft but tender to palpation.  Dressing c/d/i.  bilat calves nontender.  NVI.    Assessment/Plan: Start PT today.  Anticipate d/c home Saturday or Sunday if progressing well.     Javohn Basey M 06/30/2013, 9:49 AM

## 2013-06-30 NOTE — Consult Note (Signed)
[facility] Medical Consultation  Ryan Boyd ZOX:[id number] DOB: [birth year] DOA: 06/29/2013 PCP: Deberah CastleALTON-BETHEA,SHAWN M, MD   Requesting physician: Dr. Shon BatonBrooks Date of consultation: 06/30/2013 Reason for consultation: Ileus  Impression/Recommendations Principal Problem:   Post laminectomy syndrome Active Problems:   Back pain   Ileus, postoperative   Tachycardia   Nausea with vomiting   #1 postop ileus Likely secondary to narcotic medication. Comprehensive metabolic profile and CBC pending. Will check a magnesium level. Will keep patient n.p.o. Place on IV fluids, supportive care, antiemetics. We'll try to minimize patient's narcotic pain medication. Mobilize. Serial abdominal films. Try to keep potassium greater than 4 and magnesium levels greater than 2. Follow.  #2 tachycardia Likely secondary to pain. Patient denies any chest pain. Will check a EKG. CMET and CBC are pending. Check a magnesium level. IV fluids. Follow.  #3 nausea and vomiting Likely secondary to problem #1. IV fluids, antiemetics, supportive care. Serial abdominal films.  #4 postlaminectomy syndrome Status post anterior lumbar interbody fusion L5-S1 per Dr. Shon BatonBrooks. Per primary team.  #5 prophylaxis Lovenox for DVT prophylaxis.  I will followup again tomorrow. Please contact me if I can be of assistance in the meanwhile. Thank you for this consultation.  Chief Complaint: Back pain  HPI:  Ryan Boyd is a 33 year old gentleman with history of recurrent disc herniation L5-S1 with degenerative disc disease L5-S1, chronic low back symptoms since injury in April of 2013 who was admitted the orthopedic service with complaints of right lower extremity pain and numbness burning leg pain foot pain and pain on prolonged standing. Patient was admitted with a postlaminectomy syndrome and underwent anterior lumbar interbody fusion of L5-S1 on 06/29/2013. Postoperatively it was noted that patient had some  nausea as well as 2 episodes of emesis with some mild abdominal discomfort. Followup x-rays done for surgery the shows distended loops of bowel consistent with ileus. Patient denies any fevers. Patient does endorse some episodes of feeling cold. Patient also some nausea, nonbloody emesis x2 last episode 2 hours ago. Patient also has some abdominal discomfort as well as some gaseous pain. Patient does endorse some dysuria and generalized weakness. Patient denies any cough, no chest pain, no shortness of breath. Patient endorses some good urinary output as well as flatus. Patient denies any bowel movement at this time. Patient stated that he was able to ambulate down the hallway. We'll called by the orthopedic service for further evaluation and management.  Review of Systems:  History of present illness otherwise negative.  Past Medical History  Diagnosis Date  . Back pain, chronic    Past Surgical History  Procedure Laterality Date  . Back surgery    . Tonsillectomy     Social History:  reports that he has never smoked. He does not have any smokeless tobacco history on file. He reports that he does not drink alcohol or use illicit drugs.  Allergies  Allergen Reactions  . Erythromycin Other (See Comments)    Eye swelling (occured as a baby)  . Other Other (See Comments)    Reaction:welts HAS REACTION WITH ANY TYPE OF STEROIDS  . Prednisone     Reaction:welts all over.    No family history on file.  Prior to Admission medications   Medication Sig Start Date End Date Taking? Authorizing Provider  cloNIDine (CATAPRES - DOSED IN MG/24 HR) 0.3 mg/24hr patch Place 1 patch onto the skin once a week. 05/28/13  Yes Historical Provider, MD  docusate sodium (COLACE) 100 MG capsule Take  1 capsule (100 mg total) by mouth 2 (two) times daily. 06/30/13   Zonia Kief, PA-C  enoxaparin (LOVENOX) 40 MG/0.4ML injection Inject 0.4 mLs (40 mg total) into the skin daily. 06/30/13   Zonia Kief, PA-C   methocarbamol (ROBAXIN) 750 MG tablet Take 1 tablet (750 mg total) by mouth every 6 (six) hours as needed for muscle spasms. 06/30/13   Zonia Kief, PA-C  ondansetron (ZOFRAN ODT) 4 MG disintegrating tablet Take 1 tablet (4 mg total) by mouth every 8 (eight) hours as needed for nausea or vomiting. 06/30/13   Zonia Kief, PA-C  oxyCODONE-acetaminophen (PERCOCET) 10-325 MG per tablet Take 1-2 tablets by mouth every 6 (six) hours as needed for pain. 06/30/13   Zonia Kief, PA-C  polyethylene glycol (MIRALAX / GLYCOLAX) packet Take 17 g by mouth daily. 06/30/13   Zonia Kief, PA-C   Physical Exam: Blood pressure 132/81, pulse 137, temperature 98.8 F (37.1 C), temperature source Oral, resp. rate 22, SpO2 95.00%. Filed Vitals:   06/30/13 1349  BP: 132/81  Pulse: 137  Temp: 98.8 F (37.1 C)  Resp: 22     General:  NAD  Eyes: Pupils equal round and reactive to light and accommodation. Extraocular movements intact.  ENT: Oropharynx is clear, no lesions, no exudates. Dry mucous membranes.  Neck: Supple no lymphadenopathy.  Cardiovascular: Tachycardic no murmurs rubs or gallops  Respiratory: Clear to auscultation bilaterally anterior lung fields. No wheezing, no crackles, no rhonchi.  Abdomen: Soft, mild distention, some discomfort around surgical incision site, decreased by positive bowel sounds. No rebound no guarding.  Skin: No rashes or lesions.  Musculoskeletal: 5/5 bilateral upper extremity strength. 5/5 bilateral lower extremity strength. No lower extremity edema.  Psychiatric: Normal mood. Normal affect.  Neurologic: Good insight. Good judgment.  Labs on Admission:  Basic Metabolic Panel:  Recent Labs Lab 06/27/13 0826  NA 142  K 4.6  CL 100  CO2 25  GLUCOSE 99  BUN 16  CREATININE 1.07  CALCIUM 10.3   Liver Function Tests:  Recent Labs Lab 06/27/13 0826  AST 55*  ALT 107*  ALKPHOS 68  BILITOT 0.6  PROT 7.6  ALBUMIN 4.4   No results found for this  basename: LIPASE, AMYLASE,  in the last 168 hours No results found for this basename: AMMONIA,  in the last 168 hours CBC:  Recent Labs Lab 06/27/13 0827 06/30/13 1643  WBC 7.0 12.5*  HGB 16.8 15.0  HCT 47.6 43.3  MCV 93.2 95.0  PLT 221 203   Cardiac Enzymes: No results found for this basename: CKTOTAL, CKMB, CKMBINDEX, TROPONINI,  in the last 168 hours BNP: No components found with this basename: POCBNP,  CBG: No results found for this basename: GLUCAP,  in the last 168 hours  Radiological Exams on Admission: Dg Lumbar Spine 2-3 Views  06/30/2013   CLINICAL DATA:  Status post anterior spinal fusion for post laminectomy syndrome  EXAM: LUMBAR SPINE - 2-3 VIEW  COMPARISON:  Most recent prior radiographs 06/29/2013; preoperative radiographs 06/27/2013  FINDINGS: Surgical changes of anterior lumbar discectomy and fusion at L5-S1 with interbody graft and plate and screw construct. Unchanged hardware appearance. Mild diffuse gaseous distention of both large and small bowel raise the possibility of ileus. No acute osseous abnormality.  IMPRESSION: Gaseous distension of large and small bowel as can be seen with ileus.  Anterior discectomy and fusion of L5-S1 without significant interval change in the appearance of the hardware.   Electronically Signed   By: Vilma Prader  Archer Asa M.D.   On: 06/30/2013 08:33   Dg Lumbar Spine 2-3 Views  06/29/2013   CLINICAL DATA:  Anterior lumbar spine fusion.  EXAM: LUMBAR SPINE - 2-3 VIEW  COMPARISON:  06/27/2013 at 7:40 a.m.  FINDINGS: Two portable images show placement of an intervertebral cage at L5-S1 along the anterior central aspect of the disc interspace, maintaining height. This is fixated with 2 screws extending obliquely into the L5 vertebrae and 1 extending obliquely and inferiorly into the L5 vertebrae. The orthopedic hardware is well-seated and aligned. No evidence of an operative complication.  IMPRESSION: Anterior L5-S1 fusion as described.    Electronically Signed   By: Amie Portland M.D.   On: 06/29/2013 13:24   Dg Or Local Abdomen  06/29/2013   CLINICAL DATA:  Postoperative instrument clearance.  EXAM: OR LOCAL ABDOMEN  COMPARISON:  Lumbar Spine radiography from 2 days prior  FINDINGS: There is new anterior lumbar interbody fusion hardware at L5-S1. There is patent related to sponge overlapping the left iliac crest. Retroperitoneal gas on the left related to surgical exposure.  These results were called by telephone at the time of interpretation on 06/29/2013 at 10:57 AM to RN Mikel Cella (the circulating RN), who verbally acknowledged these results and directly communicated with the closing surgeon Lestine Mount [facility].  IMPRESSION: 1. A sponge overlaps the left iliac crest. In the single projection, cannot determine whether this is retained or superficial to the patient. 2. L5-S1 ALIF.   Electronically Signed   By: Tiburcio Pea M.D.   On: 06/29/2013 11:07    EKG: Independently reviewed. Normal sinus rhythm  Time spent: 65 minutes  Kelcee Bjorn M.D. [facility] Pager [phone]  If 7PM-7AM, please contact night-coverage [email or web address] Password [facility] 06/30/2013, 5:19 PM

## 2013-06-30 NOTE — Op Note (Signed)
Ryan Boyd, Ryan Boyd NO.:  [id number]  MEDICAL Boyd NO.:  [id number]  LOCATION:  [id number]                        FACILITY:  [facility]  PHYSICIAN:  Ryan Beal, Ryan Boyd    DATE OF BIRTH:  [birth year]  DATE OF PROCEDURE:  06/29/2013 DATE OF DISCHARGE:                              OPERATIVE REPORT   PREOPERATIVE DIAGNOSES:  Post-laminectomy syndrome, status post lumbar diskectomy with degenerative disk disease L5-S1 with recurrent right radicular leg pain, and horrific back pain.  POSTOPERATIVE DIAGNOSES:  Post-laminectomy syndrome, status post lumbar diskectomy with degenerative disk disease L5-S1 with recurrent right radicular leg pain, and horrific back pain.  OPERATIVE PROCEDURE:  Anterior lumbar interbody fusion L5-S1.  SURGEON:  Ryan Beal, Ryan Boyd  FIRST ASSISTANT:  Genene Churn. Denton Meek.  APPROACH SURGEON: 1. Durene Cal IV, Ryan Boyd  HISTORY:  This is a very pleasant young gentleman who injured himself 2 years ago following a diskectomy.  He continued to have progressive loss in quality of life.  Degenerative changes at L5-S1.  Horrific discogenic back pain.  He failed to improve with multiple conservative modalities and so he elected to proceed with surgery.  All appropriate risks, benefits, and alternatives were discussed with the patient.  Consent was obtained.  OPERATIVE NOTE:  The patient was brought to the operating room, placed supine on the operating table.  After successful induction of general anesthesia and endotracheal intubation, TEDs, SCDs, and Foley were inserted.  The patient was placed supine on the operating table.  The abdomen was prepped and draped in a standard fashion.  Time-out was taken confirming the patient, procedure, and all other pertinent important data.  Once this was completed, Dr. Myra Gianotti performed a standard retroperitoneal anterior approach to the lumbar spine via a left-sided transverse abdominal incision.  Please refer  to his dictation for specifics on the approach.  Once the retractors were properly positioned, a needle was placed into L5-S1 disc space and a lateral x-ray confirmed that we were at the L5-S1 level.  At this point, Dr. Myra Gianotti scrubbed out and I scrubbed in.  An annulotomy was performed with a 15 blade scalpel.  Using a combination of pituitary rongeurs, curettes, and Kerrison rongeurs, I removed all the disk material.  I then used sequential dilating elevators to expand the space up to.  This allowed me to get a small curved curette behind the vertebral body of S1 and released the annulus.  There was a fragment of disk material posterolateral to the right consistent with what was seen on x-ray.  A nerve hook was used to mobilize this, and I used a pituitary rongeur to excise it.  I then removed some of the overhanging osteophyte from the posterior aspect of the L5 and S1 vertebral body using a 2 mm Kerrison rongeur.  Once I had a complete diskectomy, I then used the largest curette to ensure that I removed all the cartilaginous endplate.  I then trialed with a 14 large 8 degree lordotic cage.  This provided excellent fixation with parallel distraction.  At this point, I created defect in the anterior aspect of the S1 vertebral body to contour to the implant and then aspirated  about 6 mL of blood.  This was mixed with chronOS and allowed to absorb.  I then placed the chronOS as well as DBX mix into the PEEK interbody spacer.  This was malleted into the appropriate space and depth.  He had excellent parallel distraction. I then placed the plate.  This was a 0 profile plate.  I then placed 225 mm screws into the body of L5 and a single 20 mm screw into the body of S1.  All screws had excellent purchase.  The locking cap was then applied and torqued down according to manufacturer's standards.  The wound was now copiously irrigated with normal saline.  Hemostasis was maintained with  bipolar electrocautery.  At this point, I sequentially removed each of the retractors and confirmed there was no bleeding.  Once all the self-retaining retractors were removed, I irrigated again and closed the fascia of the rectus with 0 PDS suture.  Deep layer was closed with 0 Vicryl suture and superficial layer was closed with 2-0 Vicryl.  A 3-0 Monocryl and Steri- Strips and dry dressing were applied.  Final x-rays demonstrated satisfactory position of the implant.  There was no retained surgical instruments other than the hardware that was placed for the fusion.  The patient was ultimately extubated and transferred to PACU without incident.  At the end of the case, all needle and sponge counts were correct.  First assistant, Ryan Boyd, instrumental in providing retraction, suction, irrigation, visualization, and wound closure.     Ryan Bealahari D Ayde Record, Ryan Boyd     DDB/MEDQ  D:  06/29/2013  T:  06/30/2013  Job:  [id number]

## 2013-06-30 NOTE — Progress Notes (Signed)
Occupational Therapy Evaluation and Discharge Patient Details Name: Ryan Boyd MRN: [id number] DOB: 1981/01/22 Today's Date: 06/30/2013 Time: 7793-9030 OT Time Calculation (min): 30 min  OT Assessment / Plan / Recommendation History of present illness Pt is a 33 y/o male admitted s/p anterior lumbar fusion L5-S1.    Clinical Impression   PTA pt lived at home with wife and children and was independent in ADLs, however he required extra time to RLE due to pain. Pt was aware of back precautions. Education and training provided for incorporating back precautions into ADLs and using compensatory techniques and AE for LB dressing and bathing. Pt stated that the reacher and sock aid would make LB dressing much easier. Pt reports that his wife will be available 24/7 to assist with ADLs as needed. No further OT needs at this time.     OT Assessment  Patient does not need any further OT services    Follow Up Recommendations  No OT follow up;Supervision/Assistance - 24 hour       Equipment Recommendations  None recommended by OT          Precautions / Restrictions Precautions Precautions: Fall;Back Precaution Comments: Discussed 3/3 back precautions with pt, wife, and mother-in-law Required Braces or Orthoses: Spinal Brace Spinal Brace: Lumbar corset;Applied in sitting position Restrictions Weight Bearing Restrictions: No   Pertinent Vitals/Pain Pt c/o pain but did not report level    ADL  Eating/Feeding: Independent Where Assessed - Eating/Feeding: Chair Grooming: Supervision/safety Where Assessed - Grooming: Supported standing Upper Body Bathing: Modified independent Where Assessed - Upper Body Bathing: Unsupported sitting Lower Body Bathing: Modified independent (with AE) Where Assessed - Lower Body Bathing: Supported sit to stand Upper Body Dressing: Supervision/safety (for back precautions) Where Assessed - Upper Body Dressing: Unsupported sitting Lower Body  Dressing: Modified independent (using AE (sock aid, reacher)) Where Assessed - Lower Body Dressing: Unsupported sit to stand Toilet Transfer: Supervision/safety Toilet Transfer Method: Sit to Loss adjuster, chartered: Raised toilet seat with arms (or 3-in-1 over toilet) Toileting - Clothing Manipulation and Hygiene: Independent Where Assessed - Toileting Clothing Manipulation and Hygiene: Sit to stand from 3-in-1 or toilet Equipment Used: Rolling walker;Sock aid;Reacher;Long-handled sponge        Visit Information  Last OT Received On: 06/30/13 Assistance Needed: +1 History of Present Illness: Pt is a 33 y/o male admitted s/p anterior lumbar fusion L5-S1.        Prior [place] expects to be discharged to:: Private residence Living Arrangements: Spouse/significant other;Children Available Help at Discharge: Family;Available 24 hours/day Type of Home: House Home Access: Stairs to enter [employer] of Steps: 1 Entrance Stairs-Rails: None Home Layout: One level Home Equipment: Walker - 2 wheels;Bedside commode;Shower seat (per pt being ordered) Prior Function Level of Independence: Independent Comments: Increased time to dress the RLE due to pain Communication Communication: No difficulties Dominant Hand: Left         Vision/Perception Vision - History Patient Visual Report: No change from baseline   Cognition  Cognition Arousal/Alertness: Awake/alert Behavior During Therapy: WFL for tasks assessed/performed Overall Cognitive Status: Within Functional Limits for tasks assessed    Extremity/Trunk Assessment Upper Extremity Assessment Upper Extremity Assessment: Overall WFL for tasks assessed Lower Extremity Assessment Lower Extremity Assessment: Defer to PT evaluation     Mobility Transfers Overall transfer level: Needs assistance Equipment used: Rolling walker (2 wheeled) Transfers: Sit to/from Stand Sit to  Stand: Supervision  Balance Balance Overall balance assessment: No apparent balance deficits (not formally assessed)   End of Session OT - End of Session Equipment Utilized During Treatment: Rolling walker;Back brace (AE hip kit) Activity Tolerance: Patient tolerated treatment well Patient left: in chair;with call bell/phone within reach;with family/visitor present       Juluis Rainier [phone] 06/30/2013, 4:44 PM

## 2013-06-30 NOTE — Progress Notes (Signed)
    Subjective  - POD #1  C/o pain and abdominal soreness nausea   Physical Exam:  Abd tender Palpable left pedal pulse   U/S negative for DVT    Assessment/Plan:  POD #1  Agree with slow advance of diet Mobilization per Dr. Cephus SlaterBrooks  Kalasia Crafton IV, V. WELLS 06/30/2013 1:57 PM --  Ceasar MonsFiled Vitals:   06/30/13 1349  BP: 132/81  Pulse: 137  Temp: 98.8 F (37.1 C)  Resp: 22    Intake/Output Summary (Last 24 hours) at 06/30/13 1357 Last data filed at 06/30/13 1100  Gross per 24 hour  Intake 1074.92 ml  Output   3400 ml  Net -2325.08 ml     Laboratory CBC    Component Value Date/Time   WBC 7.0 06/27/2013 0827   HGB 16.8 06/27/2013 0827   HCT 47.6 06/27/2013 0827   PLT 221 06/27/2013 0827    BMET    Component Value Date/Time   NA 142 06/27/2013 0826   K 4.6 06/27/2013 0826   CL 100 06/27/2013 0826   CO2 25 06/27/2013 0826   GLUCOSE 99 06/27/2013 0826   BUN 16 06/27/2013 0826   CREATININE 1.07 06/27/2013 0826   CALCIUM 10.3 06/27/2013 0826   GFRNONAA >90 06/27/2013 0826   GFRAA >90 06/27/2013 0826    COAG No results found for this basename: INR, PROTIME   No results found for this basename: PTT    Antibiotics Anti-infectives   Start     Dose/Rate Route Frequency Ordered Stop   06/29/13 1415  ceFAZolin (ANCEF) IVPB 1 g/50 mL premix     1 g 100 mL/hr over 30 Minutes Intravenous Every 8 hours 06/29/13 1347 06/29/13 2225       V. Charlena CrossWells Kamden Stanislaw IV, M.D. [facility] [place] Office: [phone] Pager:  [phone]

## 2013-06-30 NOTE — Progress Notes (Signed)
UR completed 

## 2013-06-30 NOTE — Evaluation (Signed)
Physical Therapy Evaluation Patient Details Name: Ryan Boyd MRN: [id number] DOB: [birth year] Today's Date: 06/30/2013 Time: [id number] PT Time Calculation (min): 22 min  PT Assessment / Plan / Recommendation History of Present Illness  Pt is a 33 y/o male admitted s/p anterior lumbar fusion L5-S1.   Clinical Impression  This patient presents with acute pain and decreased functional independence following the above mentioned procedure. At the time of PT eval, pt with reported 9/10 pain, however did not appear to be significantly limited during transfers and ambulation. This patient is appropriate for skilled PT interventions to address functional limitations, improve safety and independence with functional mobility, and return to PLOF.     PT Assessment  Patient needs continued PT services    Follow Up Recommendations  Outpatient PT (When appropriate per post-op protocol)    Does the patient have the potential to tolerate intense rehabilitation      Barriers to Discharge        Equipment Recommendations  Rolling walker with 5" wheels;3in1 (PT)    Recommendations for Other Services     Frequency Min 5X/week    Precautions / Restrictions Precautions Precautions: Fall;Back Precaution Booklet Issued: Yes (comment) Precaution Comments: Discussed 3/3 back precautions with pt and mother-in-law Required Braces or Orthoses: Spinal Brace Spinal Brace: Lumbar corset;Applied in sitting position Restrictions Weight Bearing Restrictions: No   Pertinent Vitals/Pain 9/10 pain throughout session. Pt received pain medicine prior to therapy.       Mobility  Bed Mobility General bed mobility comments: Pt sitting up in recliner upon PT arrival. Discussed log roll technique.  Transfers Overall transfer level: Needs assistance Equipment used: Rolling walker (2 wheeled) Transfers: Sit to/from Stand Sit to Stand: Min guard General transfer comment: VC's for hand placement on  seated surface for safety, as well as technique to maintain back precautions.  Ambulation/Gait Ambulation/Gait assistance: Min guard Ambulation Distance (Feet): 100 Feet Assistive device: Rolling walker (2 wheeled) Gait Pattern/deviations: Step-through pattern;Decreased stride length Gait velocity: Decreased Gait velocity interpretation: Below normal speed for age/gender General Gait Details: VC's for sequencing and safety with the RW, as well as for technique to maintain back precautions.     Exercises     PT Diagnosis: Difficulty walking;Acute pain  PT Problem List: Decreased strength;Decreased range of motion;Decreased activity tolerance;Decreased balance;Decreased mobility;Decreased knowledge of use of DME;Decreased safety awareness;Decreased knowledge of precautions;Pain PT Treatment Interventions: DME instruction;Gait training;Stair training;Functional mobility training;Therapeutic activities;Therapeutic exercise;Neuromuscular re-education;Patient/family education     PT Goals(Current goals can be found in the care plan section) Acute Rehab PT Goals Patient Stated Goal: To return home with wife PT Goal Formulation: With patient/family Time For Goal Achievement: 07/07/13 Potential to Achieve Goals: Good  Visit Information  Last PT Received On: 06/30/13 Assistance Needed: +1 History of Present Illness: Pt is a 33 y/o male admitted s/p anterior lumbar fusion L5-S1.        Prior Functioning  Home Living Family/patient expects to be discharged to:: Private residence Living Arrangements: Spouse/significant other;Children Available Help at Discharge: Family;Available 24 hours/day Type of Home: House Home Access: Stairs to enter Entergy CorporationEntrance Stairs-Number of Steps: 1 Entrance Stairs-Rails: None Home Layout: One level Home Equipment: None Prior Function Level of Independence: Independent Comments: Increased time to dress the RLE due to pain Communication Communication: No  difficulties Dominant Hand: Left    Cognition  Cognition Arousal/Alertness: Awake/alert Behavior During Therapy: WFL for tasks assessed/performed Overall Cognitive Status: Within Functional Limits for tasks assessed    Extremity/Trunk Assessment Upper  Extremity Assessment Upper Extremity Assessment: Defer to OT evaluation Lower Extremity Assessment Lower Extremity Assessment: Overall WFL for tasks assessed Cervical / Trunk Assessment Cervical / Trunk Assessment: Normal   Balance Balance Overall balance assessment: No apparent balance deficits (not formally assessed)  End of Session PT - End of Session Equipment Utilized During Treatment: Back brace Activity Tolerance: Patient tolerated treatment well Patient left: in chair;with call bell/phone within reach;with family/visitor present Nurse Communication: Mobility status  GP Functional Assessment Tool Used: Clinical judgement Functional Limitation: Mobility: Walking and moving around Mobility: Walking and Moving Around Current Status [phone]): At least 20 percent but less than 40 percent impaired, limited or restricted Mobility: Walking and Moving Around Goal Status [phone]): At least 20 percent but less than 40 percent impaired, limited or restricted   Ryan Boyd 06/30/2013, 2:22 PM  Ryan Boyd, PT, DPT Acute Rehabilitation Services Pager: [phone]

## 2013-06-30 NOTE — Progress Notes (Signed)
Patient ID: Jodi MourningBrandon Michael Boyd, male   DOB: 1981/02/02, 33 y.o.   MRN: [id number]   Just spoke with RN.  Patient still c/o nausea.  Xray this morning did show an ileus.  I spoke with hospitalist and he will see patient tonight.  Stat cbc and cmet ordered.

## 2013-06-30 NOTE — Care Management Note (Signed)
CARE MANAGEMENT NOTE 06/30/2013  Patient:  Ryan Boyd,Ryan Boyd   Account Number:  [id number]  Date Initiated:  06/30/2013  Documentation initiated by:  Vance PeperBRADY,Decarlos Empey  Subjective/Objective Assessment:   33 yr old male s/p L5-S1 anterior lumbar fusion.     Action/Plan:   Case manager spoke with patient concerning DME needs at discharge. Contacted worker's comp and instructed to call [employer] [phone]  Faxed orders to [phone].  walker and 3in1 to be delivered to Pt. room. Has family support at d/c.   Anticipated DC Date:  07/01/2013   Anticipated DC Plan:  HOME/SELF CARE      DC Planning Services  CM consult      PAC Choice  DURABLE MEDICAL EQUIPMENT   Choice offered to / List presented to:     DME arranged  WALKER - TALL  3-N-1      DME agency  OTHER     HH arranged  NA      Status of service:  In process, will continue to follow Medicare Important Message given?   (If response is "NO", the following Medicare IM given date fields will be blank) Date Medicare IM given:   Date Additional Medicare IM given:    Discharge Disposition:    Per UR Regulation:    If discussed at Long Length of Stay Meetings, dates discussed:    Comments: Patient is 6'1" tall weight is 229 lbs.

## 2013-07-01 ENCOUNTER — Inpatient Hospital Stay (HOSPITAL_COMMUNITY): Payer: Worker's Compensation

## 2013-07-01 MED ORDER — SENNOSIDES-DOCUSATE SODIUM 8.6-50 MG PO TABS: 2.0000 | ORAL_TABLET | Freq: Every day | ORAL | Status: DC

## 2013-07-01 MED ORDER — BISACODYL 10 MG RE SUPP: 10.0000 mg | Freq: Every day | RECTAL | Status: DC | PRN

## 2013-07-01 NOTE — Progress Notes (Signed)
Subjective: 2 Days Post-Op Procedure(s) (LRB): ANTERIOR LUMBAR INTERBODY FUSION Ll5-S1   (ALIF) 1 LEVEL (N/A) ABDOMINAL EXPOSURE (N/A) Patient reports pain as mild and moderate.  Pain is tolerable with his medications. Patient seen in rounds with Dr. Lequita HaltAluisio. Patient is well, but has had some minor complaints of pain in the back and belly, requiring pain medications He states that he is passing flatus and voiding okay.  No bowel movement yet. Plan is to go Home after hospital stay.  Objective: Vital signs in last 24 hours: Temp:  [98.6 F (37 C)-99.4 F (37.4 C)] 98.6 F (37 C) (03/14 0500) Pulse Rate:  [108-137] 129 (03/14 0500) Resp:  [18-22] 22 (03/14 0500) BP: (130-132)/(64-81) 130/64 mmHg (03/14 0500) SpO2:  [94 %-95 %] 95 % (03/14 0500)  Intake/Output from previous day: 03/13 0701 - 03/14 0700 In: 2270.8 [P.O.:200; I.V.:1970.8; IV Piggyback:100] Out: 200 [Urine:200] Intake/Output this shift:     Recent Labs  06/30/13 1643  HGB 15.0    Recent Labs  06/30/13 1643  WBC 12.5*  RBC 4.56  HCT 43.3  PLT 203    Recent Labs  06/30/13 1643  NA 138  K 4.0  CL 97  CO2 28  BUN 10  CREATININE 0.96  GLUCOSE 139*  CALCIUM 9.5   No results found for this basename: LABPT, INR,  in the last 72 hours  EXAM General - Patient is Alert and Appropriate Extremity - Neurovascular intact Sensation intact distally Dressing - dressing C/D/I, Incision looks good, no signs of infection Motor Function - intact, moving foot and toes well on exam.   Past Medical History  Diagnosis Date  . Back pain, chronic     Assessment/Plan: 2 Days Post-Op Procedure(s) (LRB): ANTERIOR LUMBAR INTERBODY FUSION Ll5-S1   (ALIF) 1 LEVEL (N/A) ABDOMINAL EXPOSURE (N/A) Principal Problem:   Post laminectomy syndrome Active Problems:   Back pain   Ileus, postoperative   Tachycardia   Nausea with vomiting  Estimated body mass index is 29.69 kg/(m^2) as calculated from the following:  Height as of 06/27/13: 6\' 2"  (1.88 m).   Weight as of 03/07/13: 104.923 kg (231 lb 5 oz). Up with therapy Plan for discharge tomorrow if he is feeling better and his bowels progress.  Weight-Bearing as tolerated to both legs Appreciate Medicine Consult and assistance with this patient.  Impression/Recommendations  Principal Problem:  Post laminectomy syndrome  Active Problems:  Back pain  Ileus, postoperative  Tachycardia  Nausea with vomiting  Abdominal film ordered for today. Currently NPO except for sips  Ryan Boyd, Ryan Boyd 07/01/2013, 7:36 AM

## 2013-07-01 NOTE — Progress Notes (Signed)
Physical Therapy Treatment Patient Details Name: Ryan Boyd MRN: [id number] DOB: June 08, 1980 Today's Date: 07/01/2013 Time: [id number] PT Time Calculation (min): 17 min  PT Assessment / Plan / Recommendation  History of Present Illness Pt is a 33 y/o male admitted s/p anterior lumbar fusion L5-S1.    PT Comments   Patient making great progress. Anticipate DC soon. Encouraged daily ambulation as tolerated. Patient went for xray this morning to rule out ileus. Will continue to follow  Follow Up Recommendations  Outpatient PT     Does the patient have the potential to tolerate intense rehabilitation     Barriers to Discharge        Equipment Recommendations  Rolling walker with 5" wheels;3in1 (PT)    Recommendations for Other Services    Frequency Min 5X/week   Progress towards PT Goals Progress towards PT goals: Progressing toward goals  Plan Current plan remains appropriate    Precautions / Restrictions Precautions Precautions: Fall;Back Precaution Comments: Patient able to recall all precautions Required Braces or Orthoses: Spinal Brace Spinal Brace: Lumbar corset;Applied in sitting position Restrictions Weight Bearing Restrictions: No   Pertinent Vitals/Pain 5/10 back pain. patient repositioned for comfort     Mobility  Bed Mobility General bed mobility comments: Pt sitting up in recliner upon PT arrival. Discussed log roll technique.  Transfers Overall transfer level: Modified independent Ambulation/Gait Ambulation/Gait assistance: Supervision Ambulation Distance (Feet): 400 Feet Assistive device: Rolling walker (2 wheeled) (IV poLE) Gait velocity interpretation: at or above normal speed for age/gender General Gait Details: Patient able to walk with and without RW. Cues for posture without RW. Will assess without RW again next session Stairs: Yes Stairs assistance: Min guard Stair Management: No rails;Step to pattern;Forwards Number of Stairs:  1 General stair comments: Patient able to step up without rail or RW. Good safety awareness and technique    Exercises     PT Diagnosis:    PT Problem List:   PT Treatment Interventions:     PT Goals (current goals can now be found in the care plan section)    Visit Information  Last PT Received On: 07/01/13 Assistance Needed: +1 History of Present Illness: Pt is a 33 y/o male admitted s/p anterior lumbar fusion L5-S1.     Subjective Data      Cognition  Cognition Arousal/Alertness: Awake/alert Behavior During Therapy: WFL for tasks assessed/performed Overall Cognitive Status: Within Functional Limits for tasks assessed    Balance     End of Session PT - End of Session Equipment Utilized During Treatment: Back brace Activity Tolerance: Patient tolerated treatment well Patient left: in chair;with call bell/phone within reach;with family/visitor present Nurse Communication: Mobility status   GP     Fredrich BirksRobinette, Julia Elizabeth 07/01/2013, 11:44 AM  07/01/2013 Fredrich Birksobinette, Julia Elizabeth PTA [phone] pager [phone] office

## 2013-07-01 NOTE — Progress Notes (Signed)
Chart reviewed.  Subjective: Had bowel movement. No nausea. Tolerating clears, but does not want to start solids until tomorrow.  Objective: Vital signs in last 24 hours: Temp:  [98.1 F (36.7 C)-99.4 F (37.4 C)] 98.1 F (36.7 C) (03/14 1326) Pulse Rate:  [108-129] 124 (03/14 1326) Resp:  [18-22] 18 (03/14 1326) BP: (130-134)/(64-86) 134/86 mmHg (03/14 1326) SpO2:  [94 %-98 %] 98 % (03/14 1326) Weight change:  Last BM Date: 07/01/13  Intake/Output from previous day: 03/13 0701 - 03/14 0700 In: 2270.8 [P.O.:200; I.V.:1970.8; IV Piggyback:100] Out: 200 [Urine:200] Intake/Output this shift: Total I/O In: 240 [P.O.:240] Out: -   Gen: in chair. Alert, oriented. Comfortable Lungs CTA without WRR CV RRR without MGR Abd: brace on. Bowel sounds present Ext no CCE  Lab Results:  Recent Labs  06/30/13 1643  WBC 12.5*  HGB 15.0  HCT 43.3  PLT 203   BMET  Recent Labs  06/30/13 1643  NA 138  K 4.0  CL 97  CO2 28  GLUCOSE 139*  BUN 10  CREATININE 0.96  CALCIUM 9.5    Studies/Results: Dg Lumbar Spine 2-3 Views  06/30/2013   CLINICAL DATA:  Status post anterior spinal fusion for post laminectomy syndrome  EXAM: LUMBAR SPINE - 2-3 VIEW  COMPARISON:  Most recent prior radiographs 06/29/2013; preoperative radiographs 06/27/2013  FINDINGS: Surgical changes of anterior lumbar discectomy and fusion at L5-S1 with interbody graft and plate and screw construct. Unchanged hardware appearance. Mild diffuse gaseous distention of both large and small bowel raise the possibility of ileus. No acute osseous abnormality.  IMPRESSION: Gaseous distension of large and small bowel as can be seen with ileus.  Anterior discectomy and fusion of L5-S1 without significant interval change in the appearance of the hardware.   Electronically Signed   By: Malachy MoanHeath  McCullough M.D.   On: 06/30/2013 08:33   Dg Abd 1 View  07/01/2013   CLINICAL DATA:  Ileus  EXAM: ABDOMEN - 1 VIEW  COMPARISON:  Lumbar  spine radiographs 06/02/2013  FINDINGS: There is diffuse gaseous distention of small bowel loops and colon. Overall, gaseous distention appears slightly less prominent compared to 06/30/2013. There are postoperative changes of lower lumbar spine fusion. Evaluation for free intraperitoneal air is limited on the supine radiograph, however no suspicious findings are seen. No acute bony abnormality.  IMPRESSION: Ileus pattern persists, but appears slightly less prominent compared to lumbar spine radiographs 06/30/2013.   Electronically Signed   By: Britta MccreedySusan  Turner M.D.   On: 07/01/2013 09:17    Assessment/Plan: Resolving ileus: clears today, solids tomorrow. D/c IVF. Laxatives.  Ok to go home tomorrow from my perspective if tolerates solids for breakfast.     LOS: 2 days   Christiane HaSULLIVAN,Ryan Boyd, M.D. [facility] [phone] 07/01/2013, 2:25 PM

## 2013-07-02 LAB — URINE CULTURE
Colony Count: NO GROWTH
Culture: NO GROWTH

## 2013-07-02 MED ORDER — OXYCODONE HCL 5 MG PO TABS: 5.0000 mg | ORAL_TABLET | Freq: Four times a day (QID) | ORAL | Status: DC | PRN

## 2013-07-02 MED ORDER — ONDANSETRON HCL 4 MG/2ML IJ SOLN: 4.0000 mg | Freq: Four times a day (QID) | INTRAMUSCULAR | Status: DC | PRN

## 2013-07-02 MED ORDER — MORPHINE SULFATE (PF) 1 MG/ML IV SOLN: INTRAVENOUS | Status: DC

## 2013-07-02 MED ORDER — CHLORHEXIDINE GLUCONATE 4 % EX LIQD
60.0000 mL | Freq: Once | CUTANEOUS | Status: DC
  Filled 2013-07-02: qty 60

## 2013-07-02 MED ORDER — NALOXONE HCL 0.4 MG/ML IJ SOLN: 0.4000 mg | INTRAMUSCULAR | Status: DC | PRN

## 2013-07-02 MED ORDER — SODIUM CHLORIDE 0.9 % IJ SOLN: 9.0000 mL | INTRAMUSCULAR | Status: DC | PRN

## 2013-07-02 MED ORDER — DIPHENHYDRAMINE HCL 50 MG/ML IJ SOLN: 12.5000 mg | Freq: Four times a day (QID) | INTRAMUSCULAR | Status: DC | PRN

## 2013-07-02 MED ORDER — DIPHENHYDRAMINE HCL 12.5 MG/5ML PO ELIX: 12.5000 mg | ORAL_SOLUTION | Freq: Four times a day (QID) | ORAL | Status: DC | PRN

## 2013-07-02 NOTE — Progress Notes (Signed)
Clinical Child psychotherapistocial Worker (CSW) received consult for SNF placement. Per chart and RN case manager's note patient is going home with self care. Please reconsult if further social work needs arise. CSW signing off.   Jetta LoutBailey Morgan, LCSWA Weekend CSW [phone]

## 2013-07-02 NOTE — Progress Notes (Signed)
Patient tolerated his breakfast.  Passing gas and having BMs.  Spoke with patient about bowel regimen while on pain medications.  Ok to be d/c'd from medicine standpoint.  Marlin CanaryJessica Ellyanna Holton DO

## 2013-07-02 NOTE — Discharge Instructions (Signed)
Ileus  The intestine (bowel, or gut) is a long muscular tube connecting your stomach to your rectum. If the intestine stops working, food cannot pass through. This is called an ileus. This can happen for a variety of reasons. Ileus is a major medical problem that usually requires hospitalization. If your intestine stops working because of a blockage, this is called a bowel obstruction, and is a different condition.  CAUSES    Surgery in your abdomen. This can last from a few hours to a few days.   An infection or inflammation in the belly (abdomen). This includes inflammation of the lining of the abdomen (peritonitis).   Infection or inflammation in other parts of the body, such as pneumonia or pancreatitis.   Passage of gallstones or kidney stones.   Damage to the nerves or blood vessels which go to the bowel.   Imbalance in the salts in the blood (electrolytes).   Injury to the brain and or spinal cord.   Medications. Many medications can cause ileus or make it worse. The most common of these are strong pain medications.  SYMPTOMS   Symptoms of bowel obstruction come from the bowel inactivity. They may include:   Bloating. Your belly gets bigger (distension).   Pain or discomfort in the abdomen.   Poor appetite, feeling sick to your stomach (nausea) and vomiting.   You may also not be able to hear your normal bowel sounds, such as "growling" in your stomach.  DIAGNOSIS    Your history and a physical exam will usually suggest to your caregiver that you have an ileus.   X-rays or a CT scan of your abdomen will confirm the diagnosis. X-rays, CT scans and lab tests may also suggest the cause.  TREATMENT    Rest the intestine until it starts working again. This is most often accomplished by:   Stopping intake of oral food and drink. Dehydration is prevented by using IV (intravenous) fluids.   Sometimes, a naso-gastric tube (NG tube) is needed. This is a narrow plastic tube inserted through your nose  and into your stomach. It is connected to suction to keep the stomach emptied out. This also helps treat the nausea and vomiting.   If there is an imbalance in the electrolytes, they are corrected with supplements in your intravenous fluids.   Medications that might make an ileus worse might be stopped.   There are no medications that reliably treat ileus, though your caregiver may suggest a trial of certain medications.   If your condition is slow to resolve, you will be re-evaluated to be sure another condition, such as a blockage, is not present.  Ileus is common and usually has a good outcome. Depending on cause of your ileus, it usually can be treated by your caregivers with good results. Sometimes, specialists (surgeons or gastroenterologists) are asked to assist in your care.   HOME CARE INSTRUCTIONS    Follow your caregiver's instructions regarding diet and fluid intake. This will usually include drinking plenty of clear fluids, avoiding alcohol and caffeine, and eating a gentle diet.   Follow your caregiver's instructions regarding activity. A period of rest is sometimes advised before returning to work or school.   Take only medications prescribed by your caregiver. Be especially careful with narcotic pain medication, which can slow your bowel activity and contribute to ileus.   Keep any follow-up appointments with your caregiver or specialists.  SEEK MEDICAL CARE IF:    You have a recurrence   of nausea, vomiting or abdominal discomfort.   You develop fever of more than 102 F (38.9 C).  SEEK IMMEDIATE MEDICAL CARE IF:    You have severe abdominal pain.   You are unable to keep fluids down.  Document Released: 04/09/2003 Document Revised: 06/29/2011 Document Reviewed: 08/09/2008  ExitCare Patient Information 2014 [employer].

## 2013-07-02 NOTE — Progress Notes (Signed)
   Subjective: 3 Days Post-Op Procedure(s) (LRB): ANTERIOR LUMBAR INTERBODY FUSION Ll5-S1   (ALIF) 1 LEVEL (N/A) ABDOMINAL EXPOSURE (N/A) Patient reports pain as mild.   Patient seen in rounds with Dr. Lequita HaltAluisio.  Still sore in the lower abdomen from the surgery but his belly overall is a little better.  He is passing flatus and did have a bowel movement yesterday.  He is eating a solid breakfast and if tolerates that, he will probably go home today if okay with Medicine. Patient is well, but has had some minor complaints of pain in the lower abdomen, requiring pain medications Patient is ready to go home if tolerates solids today.  Objective: Vital signs in last 24 hours: Temp:  [98.1 F (36.7 C)-98.7 F (37.1 C)] 98.5 F (36.9 C) (03/15 0606) Pulse Rate:  [99-124] 101 (03/15 0606) Resp:  [18] 18 (03/15 0606) BP: (125-134)/(71-86) 126/71 mmHg (03/15 0606) SpO2:  [96 %-98 %] 97 % (03/15 0606)  Intake/Output from previous day:  Intake/Output Summary (Last 24 hours) at 07/02/13 0738 Last data filed at 07/01/13 1300  Gross per 24 hour  Intake    240 ml  Output      0 ml  Net    240 ml    Intake/Output this shift:    Labs:  Recent Labs  06/30/13 1643  HGB 15.0    Recent Labs  06/30/13 1643  WBC 12.5*  RBC 4.56  HCT 43.3  PLT 203    Recent Labs  06/30/13 1643  NA 138  K 4.0  CL 97  CO2 28  BUN 10  CREATININE 0.96  GLUCOSE 139*  CALCIUM 9.5   No results found for this basename: LABPT, INR,  in the last 72 hours  EXAM: General - Patient is Alert and Appropriate Extremity - Neurovascular intact Sensation intact distally Incision - clean, dry, no drainage Motor Function - intact, moving feet and toes well on exam.   Assessment/Plan: 3 Days Post-Op Procedure(s) (LRB): ANTERIOR LUMBAR INTERBODY FUSION Ll5-S1   (ALIF) 1 LEVEL (N/A) ABDOMINAL EXPOSURE (N/A) Procedure(s) (LRB): ANTERIOR LUMBAR INTERBODY FUSION Ll5-S1   (ALIF) 1 LEVEL (N/A) ABDOMINAL  EXPOSURE (N/A) Past Medical History  Diagnosis Date  . Back pain, chronic    Principal Problem:   Post laminectomy syndrome Active Problems:   Back pain   Ileus, postoperative   Tachycardia   Nausea with vomiting  Estimated body mass index is 29.69 kg/(m^2) as calculated from the following:   Height as of 06/27/13: 6\' 2"  (1.88 m).   Weight as of 03/07/13: 104.923 kg (231 lb 5 oz). Up with therapy Discharge home with home health Diet - Regular diet Follow up - in 2 weeks with Dr. Shon BatonBrooks Activity - WBAT Disposition - Home Condition Upon Discharge - Pending at this time.  See how he does with solids D/C Meds - See DC Summary  Home if okay with Medical Team. RX's are on the chart and signed.  Patrica DuelERKINS, ALEXZANDREW 07/02/2013, 7:38 AM

## 2013-07-03 NOTE — Care Management Note (Signed)
CARE MANAGEMENT NOTE 07/03/2013  Patient:  Ryan MourningDUPONT,Ryan Boyd   Account Number:  [id number]  Date Initiated:  06/30/2013  Documentation initiated by:  Ryan PeperBRADY,Ryan Boyd  Subjective/Objective Assessment:   33 yr old male s/p L5-S1 anterior lumbar fusion.     Action/Plan:   Case manager spoke with patient concerning DME needs at discharge. Contacted worker's comp and instructed to call [employer] [phone]  Faxed orders to [phone].  walker and 3in1 to be delivered to Pt. room. Has family support at d/c.   Anticipated DC Date:  07/01/2013   Anticipated DC Plan:  HOME/SELF CARE      DC Planning Services  CM consult      PAC Choice  DURABLE MEDICAL EQUIPMENT   Choice offered to / List presented to:     DME arranged  WALKER - TALL  3-N-1      DME agency  OTHER     HH arranged  NA      Status of service:  Completed, signed off Medicare Important Message given?   (If response is "NO", the following Medicare IM given date fields will be blank) Date Medicare IM given:   Date Additional Medicare IM given:    Discharge Disposition:  HOME/SELF CARE  Per UR Regulation:    If discussed at Long Length of Stay Meetings, dates discussed:

## 2013-07-04 ENCOUNTER — Encounter (HOSPITAL_COMMUNITY): Payer: Self-pay | Admitting: Orthopedic Surgery

## 2013-07-06 NOTE — Discharge Summary (Signed)
Patient ID: Ryan Boyd MRN: [id number] DOB/AGE: Aug 30, 1980 30 y.o.  Admit date: 06/29/2013 Discharge date: 07/06/2013  Admission Diagnoses:  Principal Problem:   Post laminectomy syndrome Active Problems:   Back pain   Ileus, postoperative   Tachycardia   Nausea with vomiting   Discharge Diagnoses:  Principal Problem:   Post laminectomy syndrome Active Problems:   Back pain   Ileus, postoperative   Tachycardia   Nausea with vomiting  status post Procedure(s): ANTERIOR LUMBAR INTERBODY FUSION Ll5-S1   (ALIF) 1 LEVEL ABDOMINAL EXPOSURE  Past Medical History  Diagnosis Date  . Back pain, chronic     Surgeries: Procedure(s): ANTERIOR LUMBAR INTERBODY FUSION Ll5-S1   (ALIF) 1 LEVEL ABDOMINAL EXPOSURE on 06/29/2013   Consultants: Treatment Team:  Nada Libman, MD  Discharged Condition: Improved  Hospital Course: Ryan Boyd is an 33 y.o. male who was admitted 06/29/2013 for operative treatment of Post laminectomy syndrome. Patient failed conservative treatments (please see the history and physical for the specifics) and had severe unremitting pain that affects sleep, daily activities and work/hobbies. After pre-op clearance, the patient was taken to the operating room on 06/29/2013 and underwent  Procedure(s): ANTERIOR LUMBAR INTERBODY FUSION Ll5-S1   (ALIF) 1 LEVEL ABDOMINAL EXPOSURE.    Patient was given perioperative antibiotics:  Anti-infectives   Start     Dose/Rate Route Frequency Ordered Stop   06/29/13 1415  ceFAZolin (ANCEF) IVPB 1 g/50 mL premix     1 g 100 mL/hr over 30 Minutes Intravenous Every 8 hours 06/29/13 1347 06/29/13 2225       Patient was given sequential compression devices and early ambulation to prevent DVT.   Patient benefited maximally from hospital stay and there were no complications. At the time of discharge, the patient was urinating/moving their bowels without difficulty, tolerating a regular diet, pain is  controlled with oral pain medications and they have been cleared by PT/OT.   Recent vital signs: No data found.    Recent laboratory studies: No results found for this basename: WBC, HGB, HCT, PLT, NA, K, CL, CO2, BUN, CREATININE, GLUCOSE, PT, INR, CALCIUM, 2,  in the last 72 hours   Discharge Medications:     Medication List    STOP taking these medications       acetaminophen 500 MG tablet  Commonly known as:  TYLENOL     baclofen 10 MG tablet  Commonly known as:  LIORESAL     FLAX SEED OIL PO     HYDROcodone-acetaminophen 10-325 MG per tablet  Commonly known as:  NORCO     ibuprofen 200 MG tablet  Commonly known as:  ADVIL,MOTRIN     multivitamin capsule      TAKE these medications       cloNIDine 0.3 mg/24hr patch  Commonly known as:  CATAPRES - Dosed in mg/24 hr  Place 1 patch onto the skin once a week.     docusate sodium 100 MG capsule  Commonly known as:  COLACE  Take 1 capsule (100 mg total) by mouth 2 (two) times daily.     enoxaparin 40 MG/0.4ML injection  Commonly known as:  LOVENOX  Inject 0.4 mLs (40 mg total) into the skin daily.     methocarbamol 750 MG tablet  Commonly known as:  ROBAXIN  Take 1 tablet (750 mg total) by mouth every 6 (six) hours as needed for muscle spasms.     ondansetron 4 MG disintegrating tablet  Commonly  known as:  ZOFRAN ODT  Take 1 tablet (4 mg total) by mouth every 8 (eight) hours as needed for nausea or vomiting.     oxyCODONE 5 MG immediate release tablet  Commonly known as:  Oxy IR/ROXICODONE  Take 1 tablet (5 mg total) by mouth every 6 (six) hours as needed for moderate pain or severe pain.     polyethylene glycol packet  Commonly known as:  MIRALAX / GLYCOLAX  Take 17 g by mouth daily.        Diagnostic Studies: Dg Lumbar Spine 2-3 Views  06/30/2013   CLINICAL DATA:  Status post anterior spinal fusion for post laminectomy syndrome  EXAM: LUMBAR SPINE - 2-3 VIEW  COMPARISON:  Most recent prior radiographs  06/29/2013; preoperative radiographs 06/27/2013  FINDINGS: Surgical changes of anterior lumbar discectomy and fusion at L5-S1 with interbody graft and plate and screw construct. Unchanged hardware appearance. Mild diffuse gaseous distention of both large and small bowel raise the possibility of ileus. No acute osseous abnormality.  IMPRESSION: Gaseous distension of large and small bowel as can be seen with ileus.  Anterior discectomy and fusion of L5-S1 without significant interval change in the appearance of the hardware.   Electronically Signed   By: Malachy Moan M.D.   On: 06/30/2013 08:33   Dg Lumbar Spine 2-3 Views  06/29/2013   CLINICAL DATA:  Anterior lumbar spine fusion.  EXAM: LUMBAR SPINE - 2-3 VIEW  COMPARISON:  06/27/2013 at 7:40 a.m.  FINDINGS: Two portable images show placement of an intervertebral cage at L5-S1 along the anterior central aspect of the disc interspace, maintaining height. This is fixated with 2 screws extending obliquely into the L5 vertebrae and 1 extending obliquely and inferiorly into the L5 vertebrae. The orthopedic hardware is well-seated and aligned. No evidence of an operative complication.  IMPRESSION: Anterior L5-S1 fusion as described.   Electronically Signed   By: Amie Portland M.D.   On: 06/29/2013 13:24   Dg Lumbar Spine 2-3 Views  06/27/2013   CLINICAL DATA:  Preoperative evaluation for anterior lumbar fusion.  EXAM: LUMBAR SPINE - 2-3 VIEW  COMPARISON:  06/16/2012  FINDINGS: Normal alignment. Negative for fracture. Minor disc space narrowing at L4-5 and L5-S1. Preserved vertebral body heights. Normal pedicles and SI joints.  IMPRESSION: Minor lower lumbar degenerative disc disease. No acute finding by plain radiography.   Electronically Signed   By: Ruel Favors M.D.   On: 06/27/2013 08:54   Dg Abd 1 View  07/01/2013   CLINICAL DATA:  Ileus  EXAM: ABDOMEN - 1 VIEW  COMPARISON:  Lumbar spine radiographs 06/02/2013  FINDINGS: There is diffuse gaseous  distention of small bowel loops and colon. Overall, gaseous distention appears slightly less prominent compared to 06/30/2013. There are postoperative changes of lower lumbar spine fusion. Evaluation for free intraperitoneal air is limited on the supine radiograph, however no suspicious findings are seen. No acute bony abnormality.  IMPRESSION: Ileus pattern persists, but appears slightly less prominent compared to lumbar spine radiographs 06/30/2013.   Electronically Signed   By: Britta Mccreedy M.D.   On: 07/01/2013 09:17   Dg Or Local Abdomen  06/29/2013   CLINICAL DATA:  Postoperative instrument clearance.  EXAM: OR LOCAL ABDOMEN  COMPARISON:  Lumbar Spine radiography from 2 days prior  FINDINGS: There is new anterior lumbar interbody fusion hardware at L5-S1. There is patent related to sponge overlapping the left iliac crest. Retroperitoneal gas on the left related to surgical exposure.  These  results were called by telephone at the time of interpretation on 06/29/2013 at 10:57 AM to RN Mikel CellaJosh Johnson (the circulating RN), who verbally acknowledged these results and directly communicated with the closing surgeon Lestine MountJames [facility].  IMPRESSION: 1. A sponge overlaps the left iliac crest. In the single projection, cannot determine whether this is retained or superficial to the patient. 2. L5-S1 ALIF.   Electronically Signed   By: Tiburcio PeaJonathan  Watts M.D.   On: 06/29/2013 11:07        Discharge Orders   Future Orders Complete By Expires   Call MD / Call 911  As directed    Comments:     If you experience chest pain or shortness of breath, CALL 911 and be transported to the hospital emergency room.  If you develope a fever above 101 F, pus (white drainage) or increased drainage or redness at the wound, or calf pain, call your surgeon's office.   Constipation Prevention  As directed    Comments:     Drink plenty of fluids.  Prune juice may be helpful.  You may use a stool softener, such as Colace (over the  counter) 100 mg twice a day.  Use MiraLax (over the counter) for constipation as needed.   Diet - low sodium heart healthy  As directed    Discharge instructions  As directed    Comments:     Ok to shower 5 days postop.  Do not apply any creams or ointments to incision.  Do not remove steri-strips.  Can use 4x4 gauze and tape for dressing changes.  No aggressive activity.  No bending, squatting or prolonged sitting.  Mostly be in reclined position or lying down.  Must wear lumbar brace when up and ambulating.   Driving restrictions  As directed    Comments:     No driving until further notice.   Increase activity slowly as tolerated  As directed    Lifting restrictions  As directed    Comments:     No lifting until further notice.      Follow-up Information   Schedule an appointment as soon as possible for a visit with Alvy BealBROOKS,DAHARI D, MD. (need return office visit 2 weeks postop)    Specialty:  Orthopedic Surgery   Contact information:   [address] [place] [address] [phone]       Discharge Plan:  discharge to home     Signed: Naida SleightOWENS,JAMES M for Dr. Venita Lickahari Brooks [facility] [phone] 07/06/2013, 2:05 PM

## 2013-07-07 NOTE — Discharge Summary (Signed)
Agree with above 

## 2013-12-01 ENCOUNTER — Emergency Department (HOSPITAL_COMMUNITY)
Admission: EM | Admit: 2013-12-01 | Discharge: 2013-12-01 | Disposition: A | Discharge status: Home / Self Care | Payer: Worker's Compensation | Attending: Emergency Medicine | Admitting: Emergency Medicine

## 2013-12-01 ENCOUNTER — Emergency Department (HOSPITAL_COMMUNITY): Payer: Worker's Compensation

## 2013-12-01 ENCOUNTER — Encounter (HOSPITAL_COMMUNITY): Payer: Self-pay | Admitting: Emergency Medicine

## 2013-12-01 DIAGNOSIS — Z79899 Other long term (current) drug therapy: Secondary | ICD-10-CM | POA: Insufficient documentation

## 2013-12-01 DIAGNOSIS — R209 Unspecified disturbances of skin sensation: Secondary | ICD-10-CM | POA: Insufficient documentation

## 2013-12-01 DIAGNOSIS — G8929 Other chronic pain: Secondary | ICD-10-CM | POA: Insufficient documentation

## 2013-12-01 DIAGNOSIS — M545 Low back pain, unspecified: Secondary | ICD-10-CM | POA: Insufficient documentation

## 2013-12-01 MED ORDER — HYDROMORPHONE HCL PF 1 MG/ML IJ SOLN
1.0000 mg | Freq: Once | INTRAMUSCULAR | Status: AC
  Administered 2013-12-01: 1 mg via INTRAVENOUS
  Filled 2013-12-01: qty 1

## 2013-12-01 MED ORDER — OXYCODONE-ACETAMINOPHEN 5-325 MG PO TABS: 2.0000 | ORAL_TABLET | Freq: Four times a day (QID) | ORAL | Status: DC | PRN

## 2013-12-01 MED ORDER — HYDROMORPHONE HCL PF 1 MG/ML IJ SOLN
1.0000 mg | INTRAMUSCULAR | Status: DC | PRN
  Administered 2013-12-01 (×3): 1 mg via INTRAVENOUS
  Filled 2013-12-01 (×3): qty 1

## 2013-12-01 MED ORDER — GADOBENATE DIMEGLUMINE 529 MG/ML IV SOLN
20.0000 mL | Freq: Once | INTRAVENOUS | Status: AC
  Administered 2013-12-01: 20 mL via INTRAVENOUS

## 2013-12-01 MED ORDER — DIAZEPAM 5 MG/ML IJ SOLN
5.0000 mg | Freq: Once | INTRAMUSCULAR | Status: AC
  Administered 2013-12-01: 5 mg via INTRAVENOUS
  Filled 2013-12-01: qty 2

## 2013-12-01 MED ORDER — DIAZEPAM 5 MG PO TABS: 5.0000 mg | ORAL_TABLET | Freq: Two times a day (BID) | ORAL | Status: DC

## 2013-12-01 NOTE — ED Notes (Signed)
Pt in tears due to pain. Rates pain 10/10. Wife at bedside, states she has never seen pt in so much pain.

## 2013-12-01 NOTE — ED Notes (Signed)
Pt was at home and was about to sit down in his recliner when he heard a pop and began having pain in L4-L5 region pt states pain radiates across back and down both legs. Pt received 250 mcg of fentanyl en route 30-3940mins pta.

## 2013-12-01 NOTE — ED Provider Notes (Signed)
CSN: [id number]     Arrival date & time 12/01/13  0012 History   First MD Initiated Contact with Patient 12/01/13 0026     Chief Complaint  Patient presents with  . Back Pain   HPI  Hx provided by the pt and wife. Pt is a 33 yo male with hx of prior lumbar spine surgery who presents with acutely worsened low back pain. Pt states he has been having worsening chronic low back pain for several weeks and has called his orthopedic back specialist with an upcoming appointment for diagnostic spinal injections. Tonight, however, pt was getting ready to sit in his recliner chair and as he began to sit heard and felt a load pop in his low back. This caused immediate severe pain that radiated down both legs. He was unable to move or get up due to pain. EMS was called and pt was transported to ED. He was given 250 mcg of fentanyl en route without much change in pain. He denies having any perineal numbness, urinary or fecal incontinence.     Past Medical History  Diagnosis Date  . Back pain, chronic    Past Surgical History  Procedure Laterality Date  . Back surgery    . Tonsillectomy    . Anterior lat lumbar fusion N/A 06/29/2013    Procedure: ANTERIOR LUMBAR INTERBODY FUSION Ll5-S1   (ALIF) 1 LEVEL;  Surgeon: Venita Lick, MD;  Location: [facility] OR;  Service: Orthopedics;  Laterality: N/A;  . Abdominal exposure N/A 06/29/2013    Procedure: ABDOMINAL EXPOSURE;  Surgeon: Nada Libman, MD;  Location: [facility] OR;  Service: Vascular;  Laterality: N/A;   No family history on file. History  Substance Use Topics  . Smoking status: Never Smoker   . Smokeless tobacco: Not on file  . Alcohol Use: No    Review of Systems  Musculoskeletal: Positive for back pain.  Neurological: Positive for numbness. Negative for weakness.  All other systems reviewed and are negative.     Allergies  Erythromycin; Other; and Prednisone  Home Medications   Prior to Admission medications   Medication Sig Start Date End  Date Taking? Authorizing Provider  acetaminophen (TYLENOL) 500 MG tablet Take 1,000 mg by mouth every 6 (six) hours as needed for mild pain.   Yes Historical Provider, MD  Multiple Vitamin (MULTI VITAMIN DAILY PO) Take 1 tablet by mouth daily.   Yes Historical Provider, MD  oxycodone (OXY-IR) 5 MG capsule Take 5 mg by mouth 2 (two) times daily.   Yes Historical Provider, MD  polyethylene glycol (MIRALAX / GLYCOLAX) packet Take 17 g by mouth daily.   Yes Historical Provider, MD  pregabalin (LYRICA) 75 MG capsule Take 75 mg by mouth 2 (two) times daily.   Yes Historical Provider, MD   BP 128/79  Temp(Src) 98.1 F (36.7 C) (Oral)  Resp 20  SpO2 95% Physical Exam  Nursing note and vitals reviewed. Constitutional: He is oriented to person, place, and time. He appears well-developed and well-nourished.  HENT:  Head: Normocephalic.  Cardiovascular: Regular rhythm.  Tachycardia present.   Pulmonary/Chest: Effort normal and breath sounds normal. No respiratory distress.  Abdominal: Soft.  Musculoskeletal:  Reduced ROM of low back secondary to pain. Tenderness throughout the lower back.  Neurological: He is alert and oriented to person, place, and time.  Sensations to light touch equal in lower extremities. Pt has difficulty performing strength testing due to pain.  Skin: Skin is warm.  Psychiatric: He has a  normal mood and affect. His behavior is normal.    ED Course  Procedures   COORDINATION OF CARE:  Nursing notes reviewed. Vital signs reviewed. Initial pt interview and examination performed.   Filed Vitals:   12/01/13 0015 12/01/13 0019  BP:  128/79  Temp:  98.1 F (36.7 C)  TempSrc:  Oral  Resp:  20  SpO2: 95% 95%    12:43 AM-Pt seen and evaluated. Pt appears in significant pain. Has hx of chronic pain. Low mechanism of injury.  Pt continues to have significant pains. States he is unable to move or get up to walk. Reports no significant relief from meds. Pt continues to  express his concern to me about damaging something in his back. Discussed options for MR in the morning and pt does wish to have this done.  Pt discussed in sign out with [employer] Browning PA-C. He will follow MR results.   Treatment plan initiated: Medications  HYDROmorphone (DILAUDID) injection 1 mg (not administered)  diazepam (VALIUM) injection 5 mg (not administered)    Imaging Review Dg Lumbar Spine Complete  12/01/2013   CLINICAL DATA:  Low back pain radiating down into the left leg.  EXAM: LUMBAR SPINE - COMPLETE 4+ VIEW  COMPARISON:  06/30/2013.  FINDINGS: Status post anterior lumbar discectomy and fusion at L5-S1, with interbody graft and plate and screw device in place. Alignment is anatomic. No acute displaced fracture or compression type fracture of the lumbar spine. No defects of the pars interarticularis.  IMPRESSION: 1. No acute radiographic abnormality of the lumbar spine. 2. Status post anterior lumbar discectomy and fusion at L5-S1.   Electronically Signed   By: Trudie Reedaniel  Entrikin M.D.   On: 12/01/2013 02:06     MDM   Final diagnoses:  None        Angus Sellereter S Rodman Recupero, PA-C 12/01/13 0630

## 2013-12-01 NOTE — Discharge Instructions (Signed)
DO NOT TAKE MORE THAN 2 PERCOCETS or OXYCODONES every 6 hours.  Back Pain, Adult Low back pain is very common. About 1 in 5 people have back pain.The cause of low back pain is rarely dangerous. The pain often gets better over time.About half of people with a sudden onset of back pain feel better in just 2 weeks. About 8 in 10 people feel better by 6 weeks.  CAUSES Some common causes of back pain include:  Strain of the muscles or ligaments supporting the spine.  Wear and tear (degeneration) of the spinal discs.  Arthritis.  Direct injury to the back. DIAGNOSIS Most of the time, the direct cause of low back pain is not known.However, back pain can be treated effectively even when the exact cause of the pain is unknown.Answering your caregiver's questions about your overall health and symptoms is one of the most accurate ways to make sure the cause of your pain is not dangerous. If your caregiver needs more information, he or she may order lab work or imaging tests (X-rays or MRIs).However, even if imaging tests show changes in your back, this usually does not require surgery. HOME CARE INSTRUCTIONS For many people, back pain returns.Since low back pain is rarely dangerous, it is often a condition that people can learn to [facility] their own.   Remain active. It is stressful on the back to sit or stand in one place. Do not sit, drive, or stand in one place for more than 30 minutes at a time. Take short walks on level surfaces as soon as pain allows.Try to increase the length of time you walk each day.  Do not stay in bed.Resting more than 1 or 2 days can delay your recovery.  Do not avoid exercise or work.Your body is made to move.It is not dangerous to be active, even though your back may hurt.Your back will likely heal faster if you return to being active before your pain is gone.  Pay attention to your body when you bend and lift. Many people have less discomfortwhen lifting if  they bend their knees, keep the load close to their bodies,and avoid twisting. Often, the most comfortable positions are those that put less stress on your recovering back.  Find a comfortable position to sleep. Use a firm mattress and lie on your side with your knees slightly bent. If you lie on your back, put a pillow under your knees.  Only take over-the-counter or prescription medicines as directed by your caregiver. Over-the-counter medicines to reduce pain and inflammation are often the most helpful.Your caregiver may prescribe muscle relaxant drugs.These medicines help dull your pain so you can more quickly return to your normal activities and healthy exercise.  Put ice on the injured area.  Put ice in a plastic bag.  Place a towel between your skin and the bag.  Leave the ice on for 15-20 minutes, 03-04 times a day for the first 2 to 3 days. After that, ice and heat may be alternated to reduce pain and spasms.  Ask your caregiver about trying back exercises and gentle massage. This may be of some benefit.  Avoid feeling anxious or stressed.Stress increases muscle tension and can worsen back pain.It is important to recognize when you are anxious or stressed and learn ways to manage it.Exercise is a great option. SEEK MEDICAL CARE IF:  You have pain that is not relieved with rest or medicine.  You have pain that does not improve in 1 week.  You have new symptoms.  You are generally not feeling well. SEEK IMMEDIATE MEDICAL CARE IF:   You have pain that radiates from your back into your legs.  You develop new bowel or bladder control problems.  You have unusual weakness or numbness in your arms or legs.  You develop nausea or vomiting.  You develop abdominal pain.  You feel faint. Document Released: 04/06/2005 Document Revised: 10/06/2011 Document Reviewed: 08/08/2013 [facility] Patient Information 2015 [employer] [place]. This information is not intended to replace  advice given to you by your health care provider. Make sure you discuss any questions you have with your health care provider.

## 2013-12-01 NOTE — ED Provider Notes (Signed)
6:32 AM Patient signed out to me by Dammen, PA-C.  Patient has had new back pain since sitting in a recliner and hearing a pop in his back.  Pain radiates down both legs.  Plan:  MRI lumbar spine.  8:07 AM Back from MRI.  Pain is still severe.  Has not been able to ambulate.  Will give an additional valium.  MRI pending.  Sensation and strength intact bilaterally.  Discussed patient with Dr. Criss AlvineGoldston, plan for repeat pain meds, ambulate, and discharge.  9:37 AM Patient ambulates with some pain, but he is able to ambulate.  Will discharge with some additional pain meds and valium.  Patient currently taking 5 mg percocet in the morning and in the evening.  Recommend increasing this temporarily until seen by Dr. Shon BatonBrooks.  Results for orders placed during the hospital encounter of 06/29/13  URINE CULTURE      Result Value Ref Range   Specimen Description URINE, RANDOM     Special Requests NONE     Culture  Setup Time       Value: 07/01/2013 01:39     Performed at [facility]   Colony Count       Value: NO GROWTH     [facility] at [facility]   Culture       Value: NO GROWTH     Performed at [facility]   Report Status 07/02/2013 FINAL    CBC      Result Value Ref Range   WBC 12.5 (*) 4.0 - 10.5 K/uL   RBC 4.56  4.22 - 5.81 MIL/uL   Hemoglobin 15.0  13.0 - 17.0 g/dL   HCT 60.443.3  54.039.0 - 98.152.0 %   MCV 95.0  78.0 - 100.0 fL   MCH 32.9  26.0 - 34.0 pg   MCHC 34.6  30.0 - 36.0 g/dL   RDW 19.112.5  47.811.5 - 29.515.5 %   Platelets 203  150 - 400 K/uL  COMPREHENSIVE METABOLIC PANEL      Result Value Ref Range   Sodium 138  137 - 147 mEq/L   Potassium 4.0  3.7 - 5.3 mEq/L   Chloride 97  96 - 112 mEq/L   CO2 28  19 - 32 mEq/L   Glucose, Bld 139 (*) 70 - 99 mg/dL   BUN 10  6 - 23 mg/dL   Creatinine, Ser 6.210.96  0.50 - 1.35 mg/dL   Calcium 9.5  8.4 - 30.810.5 mg/dL   Total Protein 6.9  6.0 - 8.3 g/dL   Albumin 3.6  3.5 - 5.2 g/dL   AST 36  0 - 37 U/L   ALT 68 (*) 0 - 53  U/L   Alkaline Phosphatase 59  39 - 117 U/L   Total Bilirubin 1.2  0.3 - 1.2 mg/dL   GFR calc non Af Amer >90  >90 mL/min   GFR calc Af Amer >90  >90 mL/min  MAGNESIUM      Result Value Ref Range   Magnesium 2.0  1.5 - 2.5 mg/dL  URINALYSIS, ROUTINE W REFLEX MICROSCOPIC      Result Value Ref Range   Color, Urine YELLOW  YELLOW   APPearance CLEAR  CLEAR   Specific Gravity, Urine 1.018  1.005 - 1.030   pH 7.5  5.0 - 8.0   Glucose, UA NEGATIVE  NEGATIVE mg/dL   Hgb urine dipstick NEGATIVE  NEGATIVE   Bilirubin Urine NEGATIVE  NEGATIVE   Ketones, ur  NEGATIVE  NEGATIVE mg/dL   Protein, ur NEGATIVE  NEGATIVE mg/dL   Urobilinogen, UA 0.2  0.0 - 1.0 mg/dL   Nitrite NEGATIVE  NEGATIVE   Leukocytes, UA NEGATIVE  NEGATIVE   Dg Lumbar Spine Complete  12/01/2013   CLINICAL DATA:  Low back pain radiating down into the left leg.  EXAM: LUMBAR SPINE - COMPLETE 4+ VIEW  COMPARISON:  06/30/2013.  FINDINGS: Status post anterior lumbar discectomy and fusion at L5-S1, with interbody graft and plate and screw device in place. Alignment is anatomic. No acute displaced fracture or compression type fracture of the lumbar spine. No defects of the pars interarticularis.  IMPRESSION: 1. No acute radiographic abnormality of the lumbar spine. 2. Status post anterior lumbar discectomy and fusion at L5-S1.   Electronically Signed   By: Trudie Reed M.D.   On: 12/01/2013 02:06   Mr Lumbar Spine W Wo Contrast  12/01/2013   CLINICAL DATA:  Low back and LEFT leg pain. History spinal fusion March 2015.  EXAM: MRI LUMBAR SPINE WITHOUT AND WITH CONTRAST  TECHNIQUE: Multiplanar and multiecho pulse sequences of the lumbar spine were obtained without and with intravenous contrast.  CONTRAST:  20mL MULTIHANCE GADOBENATE DIMEGLUMINE 529 MG/ML IV SOLN  COMPARISON:  Radiographs 12/01/2013. 06/30/2013. Preoperative MRI 06/16/2012.  FINDINGS: Segmentation: Numbering used on prior exam preserved. Five lumbar type vertebral bodies.   Alignment:  Normal.  Vertebrae: Normal vertebral body height. Susceptibility artifact is present around L5-S1 anterior fixation hardware.  Conus medullaris: Normal termination at T12-L1.  Paraspinal tissues: Normal.  Disc levels:  T11-T12 through L3-L4 levels are normal. There is no stenosis or disc degeneration. The facet joints at these levels appear normal.  L4-L5: The disc degeneration is unchanged with disc desiccation and mild loss of height. Small RIGHT central annular tear and associated shallow protrusion. This minimally indents the ventral thecal sac and is unchanged compared to 06/16/2012. There is no resulting stenosis. Lateral recesses are patent. Facet joints appear normal.  L5-S1: Discectomy with anterior interbody bone graft. No stenosis. RIGHT paraspinal muscular atrophy is present, suggesting prior RIGHT laminotomy. Enhancing epidural fibrosis is present around the RIGHT side of the thecal sac and extending along the descending. The shows post gadolinium enhancement RIGHT S1 nerve. There is no recurrent stenosis. No gross complicating features of the fusion.  Sacral marrow signal is within normal limits. There is no abnormal enhancement after contrast administration.  IMPRESSION: 1. Uncomplicated L5-S1 anterior lumbar interbody fusion. Epidural and S1 perineural fibrosis. No stenosis. 2. Unchanged small RIGHT central L4-L5 protrusion and annular tear.   Electronically Signed   By: Andreas Newport M.D.   On: 12/01/2013 08:20      Roxy Horseman, PA-C 12/01/13 [phone]

## 2013-12-01 NOTE — ED Provider Notes (Signed)
Medical screening examination/treatment/procedure(s) were performed by non-physician practitioner and as supervising physician I was immediately available for consultation/collaboration.   EKG Interpretation None       Olivia Mackielga M Swara Donze, MD 12/01/13 [phone]

## 2013-12-02 NOTE — ED Provider Notes (Signed)
Medical screening examination/treatment/procedure(s) were performed by non-physician practitioner and as supervising physician I was immediately available for consultation/collaboration.   EKG Interpretation None       Mercedez Boule M Xaiver Roskelley, MD 12/02/13 0607 

## 2014-06-30 ENCOUNTER — Encounter (HOSPITAL_COMMUNITY): Payer: Self-pay | Admitting: *Deleted

## 2014-06-30 ENCOUNTER — Emergency Department (HOSPITAL_COMMUNITY): Payer: Self-pay

## 2014-06-30 ENCOUNTER — Inpatient Hospital Stay (HOSPITAL_COMMUNITY)
Admission: EM | Admit: 2014-06-30 | Discharge: 2014-07-03 | DRG: 918 | Disposition: A | Discharge status: Home / Self Care | Payer: Medicaid Other | Attending: Internal Medicine | Admitting: Internal Medicine

## 2014-06-30 DIAGNOSIS — Y92009 Unspecified place in unspecified non-institutional (private) residence as the place of occurrence of the external cause: Secondary | ICD-10-CM

## 2014-06-30 DIAGNOSIS — T50901A Poisoning by unspecified drugs, medicaments and biological substances, accidental (unintentional), initial encounter: Secondary | ICD-10-CM | POA: Diagnosis present

## 2014-06-30 DIAGNOSIS — R338 Other retention of urine: Secondary | ICD-10-CM

## 2014-06-30 DIAGNOSIS — Z981 Arthrodesis status: Secondary | ICD-10-CM

## 2014-06-30 DIAGNOSIS — T50904A Poisoning by unspecified drugs, medicaments and biological substances, undetermined, initial encounter: Secondary | ICD-10-CM | POA: Diagnosis not present

## 2014-06-30 DIAGNOSIS — H109 Unspecified conjunctivitis: Secondary | ICD-10-CM | POA: Diagnosis present

## 2014-06-30 DIAGNOSIS — F4323 Adjustment disorder with mixed anxiety and depressed mood: Secondary | ICD-10-CM | POA: Diagnosis present

## 2014-06-30 DIAGNOSIS — M5440 Lumbago with sciatica, unspecified side: Secondary | ICD-10-CM | POA: Diagnosis not present

## 2014-06-30 DIAGNOSIS — G8929 Other chronic pain: Secondary | ICD-10-CM | POA: Diagnosis present

## 2014-06-30 DIAGNOSIS — Z79899 Other long term (current) drug therapy: Secondary | ICD-10-CM

## 2014-06-30 DIAGNOSIS — M549 Dorsalgia, unspecified: Secondary | ICD-10-CM | POA: Diagnosis present

## 2014-06-30 DIAGNOSIS — T50901D Poisoning by unspecified drugs, medicaments and biological substances, accidental (unintentional), subsequent encounter: Secondary | ICD-10-CM | POA: Diagnosis not present

## 2014-06-30 DIAGNOSIS — R339 Retention of urine, unspecified: Secondary | ICD-10-CM | POA: Diagnosis present

## 2014-06-30 DIAGNOSIS — T40601A Poisoning by unspecified narcotics, accidental (unintentional), initial encounter: Principal | ICD-10-CM | POA: Diagnosis present

## 2014-06-30 DIAGNOSIS — M545 Low back pain: Secondary | ICD-10-CM | POA: Diagnosis present

## 2014-06-30 DIAGNOSIS — T50904D Poisoning by unspecified drugs, medicaments and biological substances, undetermined, subsequent encounter: Secondary | ICD-10-CM | POA: Diagnosis not present

## 2014-06-30 LAB — I-STAT CHEM 8, ED
BUN: 16 mg/dL (ref 6–23)
Calcium, Ion: 1.28 mmol/L — ABNORMAL HIGH (ref 1.12–1.23)
Chloride: 101 mmol/L (ref 96–112)
Creatinine, Ser: 1.1 mg/dL (ref 0.50–1.35)
Glucose, Bld: 98 mg/dL (ref 70–99)
HCT: 46 % (ref 39.0–52.0)
Hemoglobin: 15.6 g/dL (ref 13.0–17.0)
Potassium: 4.3 mmol/L (ref 3.5–5.1)
Sodium: 142 mmol/L (ref 135–145)
TCO2: 28 mmol/L (ref 0–100)

## 2014-06-30 LAB — COMPREHENSIVE METABOLIC PANEL WITH GFR
ALT: 30 U/L (ref 0–53)
AST: 32 U/L (ref 0–37)
Albumin: 4 g/dL (ref 3.5–5.2)
Alkaline Phosphatase: 68 U/L (ref 39–117)
Anion gap: 9 (ref 5–15)
BUN: 15 mg/dL (ref 6–23)
CO2: 28 mmol/L (ref 19–32)
Calcium: 9.4 mg/dL (ref 8.4–10.5)
Chloride: 104 mmol/L (ref 96–112)
Creatinine, Ser: 1 mg/dL (ref 0.50–1.35)
GFR calc Af Amer: >90 mL/min
GFR calc non Af Amer: >90 mL/min
Glucose, Bld: 101 mg/dL — ABNORMAL HIGH (ref 70–99)
Potassium: 4.4 mmol/L (ref 3.5–5.1)
Sodium: 141 mmol/L (ref 135–145)
Total Bilirubin: 0.3 mg/dL (ref 0.3–1.2)
Total Protein: 7 g/dL (ref 6.0–8.3)

## 2014-06-30 LAB — URINALYSIS, ROUTINE W REFLEX MICROSCOPIC
Bilirubin Urine: NEGATIVE
Glucose, UA: NEGATIVE mg/dL
Hgb urine dipstick: NEGATIVE
Ketones, ur: NEGATIVE mg/dL
Leukocytes, UA: NEGATIVE
Nitrite: NEGATIVE
Protein, ur: NEGATIVE mg/dL
Specific Gravity, Urine: 1.014 (ref 1.005–1.030)
Urobilinogen, UA: 0.2 mg/dL (ref 0.0–1.0)
pH: 6 (ref 5.0–8.0)

## 2014-06-30 LAB — CBC WITH DIFFERENTIAL/PLATELET
Basophils Absolute: 0 10*3/uL (ref 0.0–0.1)
Basophils Relative: 0 % (ref 0–1)
Eosinophils Absolute: 0.1 10*3/uL (ref 0.0–0.7)
Eosinophils Relative: 1 % (ref 0–5)
HCT: 44.3 % (ref 39.0–52.0)
Hemoglobin: 14.7 g/dL (ref 13.0–17.0)
LYMPHS ABS: 3.8 10*3/uL (ref 0.7–4.0)
Lymphocytes Relative: 45 % (ref 12–46)
MCH: 32.5 pg (ref 26.0–34.0)
MCHC: 33.2 g/dL (ref 30.0–36.0)
MCV: 98 fL (ref 78.0–100.0)
MONOS PCT: 10 % (ref 3–12)
Monocytes Absolute: 0.9 10*3/uL (ref 0.1–1.0)
NEUTROS ABS: 3.7 10*3/uL (ref 1.7–7.7)
Neutrophils Relative %: 44 % (ref 43–77)
PLATELETS: 199 10*3/uL (ref 150–400)
RBC: 4.52 MIL/uL (ref 4.22–5.81)
RDW: 13.2 % (ref 11.5–15.5)
WBC: 8.4 10*3/uL (ref 4.0–10.5)

## 2014-06-30 LAB — RAPID URINE DRUG SCREEN, HOSP PERFORMED
Amphetamines: NOT DETECTED
BARBITURATES: NOT DETECTED
Benzodiazepines: POSITIVE — AB
Cocaine: NOT DETECTED
Opiates: POSITIVE — AB
Tetrahydrocannabinol: NOT DETECTED

## 2014-06-30 LAB — ETHANOL

## 2014-06-30 LAB — ACETAMINOPHEN LEVEL: Acetaminophen (Tylenol), Serum: <10 ug/mL — ABNORMAL LOW (ref 10–30)

## 2014-06-30 LAB — SALICYLATE LEVEL: Salicylate Lvl: <4 mg/dL (ref 2.8–20.0)

## 2014-06-30 LAB — CBG MONITORING, ED: Glucose-Capillary: 93 mg/dL (ref 70–99)

## 2014-06-30 MED ORDER — NALOXONE HCL 1 MG/ML IJ SOLN
1.0000 mg | Freq: Once | INTRAMUSCULAR | Status: AC
  Administered 2014-06-30: 1 mg via INTRAVENOUS
  Filled 2014-06-30: qty 2

## 2014-06-30 MED ORDER — SODIUM CHLORIDE 0.9 % IJ SOLN
3.0000 mL | Freq: Two times a day (BID) | INTRAMUSCULAR | Status: DC
  Administered 2014-07-01 – 2014-07-03 (×4): 3 mL via INTRAVENOUS

## 2014-06-30 MED ORDER — NALOXONE HCL 0.4 MG/ML IJ SOLN
INTRAMUSCULAR | Status: AC
  Administered 2014-06-30: 0.4 mg
  Filled 2014-06-30: qty 1

## 2014-06-30 MED ORDER — ONDANSETRON HCL 4 MG/2ML IJ SOLN: 4.0000 mg | Freq: Four times a day (QID) | INTRAMUSCULAR | Status: DC | PRN

## 2014-06-30 MED ORDER — NALOXONE HCL 0.4 MG/ML IJ SOLN: 0.4000 mg | Freq: Once | INTRAMUSCULAR | Status: AC

## 2014-06-30 MED ORDER — BISACODYL 10 MG RE SUPP: 10.0000 mg | Freq: Every day | RECTAL | Status: DC | PRN

## 2014-06-30 MED ORDER — KCL IN DEXTROSE-NACL 20-5-0.45 MEQ/L-%-% IV SOLN
INTRAVENOUS | Status: DC
  Administered 2014-06-30: 100 mL via INTRAVENOUS
  Administered 2014-07-01: 09:00:00 via INTRAVENOUS
  Filled 2014-06-30 (×2): qty 1000

## 2014-06-30 MED ORDER — ONDANSETRON HCL 4 MG/2ML IJ SOLN
4.0000 mg | Freq: Once | INTRAMUSCULAR | Status: AC
  Administered 2014-06-30: 4 mg via INTRAVENOUS
  Filled 2014-06-30: qty 2

## 2014-06-30 MED ORDER — ONDANSETRON HCL 4 MG PO TABS: 4.0000 mg | ORAL_TABLET | Freq: Four times a day (QID) | ORAL | Status: DC | PRN

## 2014-06-30 MED ORDER — ENOXAPARIN SODIUM 40 MG/0.4ML ~~LOC~~ SOLN
40.0000 mg | SUBCUTANEOUS | Status: DC
  Administered 2014-06-30 – 2014-07-02 (×3): 40 mg via SUBCUTANEOUS
  Filled 2014-06-30 (×4): qty 0.4

## 2014-06-30 MED ORDER — NALOXONE HCL 1 MG/ML IJ SOLN
1.0000 mg | Freq: Once | INTRAMUSCULAR | Status: DC
  Filled 2014-06-30: qty 2

## 2014-06-30 NOTE — Progress Notes (Signed)
Pt having apneic episodes on the monitor, and having shallow respirations. Pt able to open eyes to stimulation. Family at bedside concerned, [facility] floor coverage notified, order for 0.4mg  Narcan was received and administered.Current vital signs HR 87, BP 112/65, RR 14

## 2014-06-30 NOTE — ED Notes (Signed)
Bed: RESA Expected date: 06/30/14 Expected time: 1:46 PM Means of arrival: Ambulance Comments: Accidental OD

## 2014-06-30 NOTE — ED Notes (Signed)
Condom cath applied. Will get urine when pt urinates.

## 2014-06-30 NOTE — ED Notes (Signed)
Attempted to call report to ICU - Receiving nurse suctioning a patient.

## 2014-06-30 NOTE — ED Provider Notes (Signed)
Medical screening examination/treatment/procedure(s) were conducted as a shared visit with non-physician practitioner(s) and myself.  I personally evaluated the patient during the encounter.   EKG Interpretation   Date/Time:  Saturday June 30 2014 14:09:35 EST Ventricular Rate:  77 PR Interval:  183 QRS Duration: 88 QT Interval:  366 QTC Calculation: 414 R Axis:   76 Text Interpretation:  Sinus rhythm Confirmed by WARD,  DO, KRISTEN [phone]([id number])  on 06/30/2014 3:15:29 PM      Pt is a 34 y.o. male with history of chronic back pain who recently had a neurostimulator placed with Dr. Shon BatonBrooks yesterday who presents to the emergency department with an overdose. Patient was prescribed Percocet 10/325 mg tablets #90 on 3/2 and only had 4 left in the bottle. Also prescribed Robaxin 500 mg #90 on 2/3 and only had 13 tablets left. Was also found to have a bag with Xanax 2 mg and morphine 30 mg extended release that he does not have a prescription for. Wife reports that she also gave the patient Tylenol p.m. because she thought that he was hurting. On exam, patient is very drowsy, rest rate less than 10, initially cyanotic with EMS but improved with oxygen. He has slurred speech but is following commands and answering questions when stimulated. Patient requiring stimulation repeatedly to keep him from becoming apneic. Given Narcan and now more awake but still very drowsy.  Labs unremarkable. Tylenol level negative. Salicylate level negative. Alcohol level negative.  Chest x-ray shows decreased lung volumes with what appears to be atelectasis versus aspiration. No known history of aspiration per family. We'll continue to closely monitor patient and give Narcan as needed. Given he did have access to extended release morphine he may need admission and Narcan drip if does not improve in the ED.  Layla MawKristen N Ward, DO 06/30/14 1521

## 2014-06-30 NOTE — H&P (Signed)
[facility] History and Physical  Heyward Douthit ZOX:[id number] DOB: 28-Aug-1980 DOA: 06/30/2014  Referring physician:  Rochele Raring PCP:  Deberah Castle, MD   Chief Complaint:  Drug overdose  HPI:  The patient is a 34 y.o. year-old male with history of chronic low back pain who presents with drug overdose.  History obtained from family and chart as patient not able to give history due to sedation.  After an injury in April 2013, he developed debilitating back pain.  He failed medical management and underwent diskectomy and decompression with lumbar fusion in 06/2013.  He continued to have severe pain despite surgery.  He underwent neurostimilator placement on 3/11 (day prior to admission).  Post-procedure he took excessive medication and was found by his wife unresponsive on the bed.  His wife called the patient's mother who is a nurse who came over right away and discovered that he was very difficult to arouse and had decreased respirations.  They called EMS.  When EMS arrived, he was cyanotic but recovered with oxygen.  According to the family, he was prescribed Percocet 10/325 mg tablets #90 on 3/2 and only had 4 left in the bottle. He was also prescribed Robaxin 500 mg #90 on 2/3 and only had 13 tablets left. He had a bag with Xanax 2 mg and morphine 30 mg extended release that he does not have a prescription for.  The patient reported that he took two doses of valium and some xanax.  Wife reported that he also received a dose of Tylenol p.m the night before.    In the ER, labs were stable.  CXR demonstrated low lung volumes with atelectasis vs. Possible aspiration.  He was given a dose of narcan and zofran.  After narcan, he awoke, but shortly thereafter he fell asleep again.  He has been protecting his airway.  Poison control recommended observation for sedation for at least 12 hours.  He is being admitted to stepdown for continuous monitoring.    Review of Systems:  Unable to  obtain, patient sedated.    Past Medical History  Diagnosis Date  . Back pain, chronic    Past Surgical History  Procedure Laterality Date  . Lumbar disc surgery    . Tonsillectomy    . Anterior lat lumbar fusion N/A 06/29/2013    Procedure: ANTERIOR LUMBAR INTERBODY FUSION Ll5-S1   (ALIF) 1 LEVEL;  Surgeon: Venita Lick, MD;  Location: [facility] OR;  Service: Orthopedics;  Laterality: N/A;  . Abdominal exposure N/A 06/29/2013    Procedure: ABDOMINAL EXPOSURE;  Surgeon: Nada Libman, MD;  Location: [facility] OR;  Service: Vascular;  Laterality: N/A;   Social History:  reports that he has never smoked. He does not have any smokeless tobacco history on file. He reports that he does not drink alcohol or use illicit drugs.  Allergies  Allergen Reactions  . Erythromycin Other (See Comments)    Eye swelling (occured as a baby)  . Other Other (See Comments)    Reaction:welts HAS REACTION WITH ANY TYPE OF STEROIDS  . Prednisone     Reaction:welts all over.     Family History  Problem Relation Age of Onset  . Alcoholism Father   . Depression Neg Hx   . Anxiety disorder Neg Hx      Prior to Admission medications   Medication Sig Start Date End Date Taking? Authorizing Provider  methocarbamol (ROBAXIN) 500 MG tablet Take 500 mg by mouth 3 (three) times daily as needed  for muscle spasms.   Yes Historical Provider, MD  oxyCODONE-acetaminophen (PERCOCET) 10-325 MG per tablet Take 1 tablet by mouth 3 (three) times daily as needed for pain.   Yes Historical Provider, MD  pregabalin (LYRICA) 75 MG capsule Take 150 mg by mouth 2 (two) times daily.    Yes Historical Provider, MD  acetaminophen (TYLENOL) 500 MG tablet Take 1,000 mg by mouth every 6 (six) hours as needed for mild pain.    Historical Provider, MD  diazepam (VALIUM) 5 MG tablet Take 1 tablet (5 mg total) by mouth 2 (two) times daily. Patient not taking: Reported on 06/30/2014 12/01/13   Roxy Horseman, PA-C  Multiple Vitamin (MULTI VITAMIN  DAILY PO) Take 1 tablet by mouth daily.    Historical Provider, MD  oxycodone (OXY-IR) 5 MG capsule Take 5 mg by mouth 2 (two) times daily.    Historical Provider, MD  oxyCODONE-acetaminophen (PERCOCET/ROXICET) 5-325 MG per tablet Take 2 tablets by mouth every 6 (six) hours as needed for severe pain. Patient not taking: Reported on 06/30/2014 12/01/13   Roxy Horseman, PA-C  polyethylene glycol [facility] / GLYCOLAX) packet Take 17 g by mouth daily.    Historical Provider, MD   Physical Exam: Filed Vitals:   06/30/14 1700 06/30/14 1730 06/30/14 1734 06/30/14 1800  BP: 127/77 123/75 123/75 129/85  Pulse: 102 105 99 98  Resp: SpO2: 100% 100% 99% 100%     General:  Snoring adult male, NAD, arouseable  Eyes:  PERRL, small but reactive, anicteric, non-injected  ENT:  Nares clear.  OP clear, non-erythematous without plaques or exudates.  MMM.    Neck:  Supple without TM or JVD.    Lymph:  No cervical, supraclavicular, or submandibular LAD.  Cardiovascular:  RRR, normal S1, S2, without m/r/g.  2+ pulses, warm extremities  Respiratory:  CTA bilaterally without increased WOB.  Abdomen:  NABS.  Soft, ND/NT.    Skin:  No rashes or focal lesions.  Musculoskeletal:  Normal bulk and tone.  No LE edema.  Psychiatric:  Asleep but arousable  Neurologic:  No facial, grossly moves all extremities.   Labs on Admission:  Basic Metabolic Panel:  Recent Labs Lab 06/30/14 1433 06/30/14 1439  NA 141 142  K 4.4 4.3  CL 104 101  CO2 28  --   GLUCOSE 101* 98  BUN 15 16  CREATININE 1.00 1.10  CALCIUM 9.4  --    Liver Function Tests:  Recent Labs Lab 06/30/14 1433  AST 32  ALT 30  ALKPHOS 68  BILITOT 0.3  PROT 7.0  ALBUMIN 4.0   No results for input(s): LIPASE, AMYLASE in the last 168 hours. No results for input(s): AMMONIA in the last 168 hours. CBC:  Recent Labs Lab 06/30/14 1433 06/30/14 1439  WBC 8.4  --   NEUTROABS 3.7  --   HGB 14.7 15.6  HCT 44.3 46.0   MCV 98.0  --   PLT 199  --    Cardiac Enzymes: No results for input(s): CKTOTAL, CKMB, CKMBINDEX, TROPONINI in the last 168 hours.  BNP (last 3 results) No results for input(s): BNP in the last 8760 hours.  ProBNP (last 3 results) No results for input(s): PROBNP in the last 8760 hours.  CBG:  Recent Labs Lab 06/30/14 1409  GLUCAP 93    Radiological Exams on Admission: Dg Chest Port 1 View  06/30/2014   CLINICAL DATA:  Overdose.  EXAM: CHEST  2 VIEW  COMPARISON:  None.  FINDINGS: Borderline enlarged cardiac silhouette and mediastinal contours, potentially accentuated due to decreased lung volumes supine patient positioning. There is mild elevation of the right hemidiaphragm. Minimal right basilar linear heterogeneous opacities. Pulmonary venous congestion without frank evidence of edema. No supine evidence of pneumothorax or pleural effusion. No evidence of edema. No acute osseus abnormalities. A spinal stimulator device overlies the lower thoracic spine.  IMPRESSION: Decreased lung volumes with right basilar heterogeneous opacities, possibly atelectasis though infection and/or aspiration could have a similar appearance. Further evaluation with a PA and lateral chest radiograph may be obtained as clinically indicated.   Electronically Signed   By: Simonne ComeJohn  Watts M.D.   On: 06/30/2014 14:44    EKG: Independently reviewed. NSR  Assessment/Plan Active Problems:   Overdose  ---  Overdose on prescription narcotics and possibly benzodiazepines, suspect unintentional, but unable to obtain history.  Per family, patient may have some depression and they are requesting psychiatry consultation. -  Admit to stepdown -  Continuous pulse ox/CO2 monitoring -  Narcan prn -  NPO with IVF -  Hold all narcotics and sedating medications -  Please call psychiatry consultation after patient awakes -  Sitter at bedside -  Suicide precautions -  Followed by and has pain contract with Dr. Ethelene Halamos -  please call him on Monday to discuss pain management.   May have opioid-induced constipation -  Bisacodyl suppositories prn until patient alert  Bladder feels distended - suspect acute urinary retention secondary to narcotic overdose  -  PVR and place foley if > 300mL urine  Diet:  NPO Access:  PIV IVF:  yes Proph:  lovenox  Code Status: FULL Family Communication: patient and his mother Disposition Plan: Admit to stepdown  Time spent: 60 min Renae FickleSHORT, Ilay Capshaw [facility] Pager [phone]  If 7PM-7AM, please contact night-coverage [email or web address] Password [email or web address] 06/30/2014, 6:35 PM

## 2014-06-30 NOTE — ED Provider Notes (Signed)
CSN: [id number]     Arrival date & time 06/30/14  1408 History   First MD Initiated Contact with Patient 06/30/14 1411     Chief Complaint  Patient presents with  . Drug Overdose     (Consider location/radiation/quality/duration/timing/severity/associated sxs/prior Treatment) HPI   Ryan Boyd is a(n) 34 y.o. male who presents via EMS for Overdose. He is s/p lumbar stimulator placement by Dr. Shon Baton last week. The patient is prescribed Percocet. According to his prescription. He is supposed to have 60 pills left. However, the only found before today. He was also given a Tylenol PM by his wife earlier this morning. The patient is also taking morphine from an unknown an unprescribed source. It is unknown , so morphine. The patient took or if it is short acting or long acting. Patient also took an unknown amount of Lyrica. Upon arrival, the patient is somnolent but responds to questions.   Past Medical History  Diagnosis Date  . Back pain, chronic    Past Surgical History  Procedure Laterality Date  . Lumbar disc surgery    . Tonsillectomy    . Anterior lat lumbar fusion N/A 06/29/2013    Procedure: ANTERIOR LUMBAR INTERBODY FUSION Ll5-S1   (ALIF) 1 LEVEL;  Surgeon: Venita Lick, MD;  Location: [facility] OR;  Service: Orthopedics;  Laterality: N/A;  . Abdominal exposure N/A 06/29/2013    Procedure: ABDOMINAL EXPOSURE;  Surgeon: Nada Libman, MD;  Location: [facility] OR;  Service: Vascular;  Laterality: N/A;   Family History  Problem Relation Age of Onset  . Alcoholism Father   . Depression Neg Hx   . Anxiety disorder Neg Hx    History  Substance Use Topics  . Smoking status: Never Smoker   . Smokeless tobacco: Not on file  . Alcohol Use: No    Review of Systems Unable to review due to altered mental status   Allergies  Erythromycin; Other; and Prednisone  Home Medications   Prior to Admission medications   Medication Sig Start Date End Date Taking? Authorizing Provider    acetaminophen (TYLENOL) 500 MG tablet Take 1,000 mg by mouth every 6 (six) hours as needed for mild pain.   Yes Historical Provider, MD  oxyCODONE-acetaminophen (PERCOCET) 10-325 MG per tablet Take 1 tablet by mouth 3 (three) times daily as needed for pain.   Yes Historical Provider, MD  pregabalin (LYRICA) 75 MG capsule Take 150 mg by mouth 2 (two) times daily.    Yes Historical Provider, MD  cloNIDine (CATAPRES) 0.1 MG tablet Take 1 tablet (0.1 mg total) by mouth 2 (two) times daily. 07/03/14   Renae Fickle, MD  lidocaine (LIDODERM) 5 % Place 2 patches onto the skin every evening. Remove & Discard patch within 12 hours or as directed by MD 07/03/14   Renae Fickle, MD  meloxicam (MOBIC) 15 MG tablet Take 1 tablet (15 mg total) by mouth daily. 07/03/14   Renae Fickle, MD  sertraline (ZOLOFT) 100 MG tablet Take a half tab per day for next 7 days, then increase to a full tab thereafter. 07/03/14   Renae Fickle, MD   BP 106/59 mmHg  Pulse 74  Temp(Src) 98.1 F (36.7 C) (Oral)  Resp 18  Ht  (1.854 m)  Wt 218 lb 14.7 oz (99.3 kg)  BMI 28.89 kg/m2  SpO2 97% Physical Exam  Constitutional: He appears well-developed and well-nourished. He appears lethargic. No distress.  HENT:  Head: Normocephalic and atraumatic.  Eyes: Conjunctivae are normal.  Neck: Normal range of motion. No JVD present.  Cardiovascular: Normal rate, regular rhythm and normal heart sounds.   Pulmonary/Chest:  Shallow breathing  Abdominal: Soft. He exhibits no distension. There is no tenderness.  Musculoskeletal:       Arms: Neurological: He has normal strength. He appears lethargic. No cranial nerve deficit or sensory deficit. GCS eye subscore is 3. GCS verbal subscore is 5. GCS motor subscore is 6.  Nursing note and vitals reviewed.   ED Course  Procedures (including critical care time) Labs Review Labs Reviewed  ACETAMINOPHEN LEVEL - Abnormal; Notable for the following:    Acetaminophen (Tylenol),  Serum <10.0 (*)    All other components within normal limits  COMPREHENSIVE METABOLIC PANEL - Abnormal; Notable for the following:    Glucose, Bld 101 (*)    All other components within normal limits  URINE RAPID DRUG SCREEN (HOSP PERFORMED) - Abnormal; Notable for the following:    Opiates POSITIVE (*)    Benzodiazepines POSITIVE (*)    All other components within normal limits  BASIC METABOLIC PANEL - Abnormal; Notable for the following:    Glucose, Bld 108 (*)    All other components within normal limits  I-STAT CHEM 8, ED - Abnormal; Notable for the following:    Calcium, Ion 1.28 (*)    All other components within normal limits  URINE CULTURE  MRSA PCR SCREENING  CBC WITH DIFFERENTIAL/PLATELET  ETHANOL  SALICYLATE LEVEL  URINALYSIS, ROUTINE W REFLEX MICROSCOPIC  CBG MONITORING, ED  CBG MONITORING, ED    Imaging Review No results found.   EKG Interpretation   Date/Time:  Saturday June 30 2014 14:09:35 EST Ventricular Rate:  77 PR Interval:  183 QRS Duration: 88 QT Interval:  366 QTC Calculation: 414 R Axis:   76 Text Interpretation:  Sinus rhythm Confirmed by WARD,  DO, KRISTEN ([id number])  on 06/30/2014 3:15:29 PM        Date: 06/30/2014  Rate: 77  Rhythm: normal sinus rhythm  QRS Axis: normal  Intervals: normal  ST/T Wave abnormalities: normal  Conduction Disutrbances:none  Narrative Interpretation:   Old EKG Reviewed: unchanged  CRITICAL CARE Performed by: Arthor CaptainHarris, Willisha Sligar   Total critical care time: 45  Critical care time was exclusive of separately billable procedures and treating other patients.  Critical care was necessary to treat or prevent imminent or life-threatening deterioration.  Critical care was time spent personally by me on the following activities: development of treatment plan with patient and/or surrogate as well as nursing, discussions with consultants, evaluation of patient's response to treatment, examination of patient, obtaining  history from patient or surrogate, ordering and performing treatments and interventions, ordering and review of laboratory studies, ordering and review of radiographic studies, pulse oximetry and re-evaluation of patient's condition.   MDM   Final diagnoses:  Drug overdose, accidental or unintentional, initial encounter    11:56 AM BP 106/59 mmHg  Pulse 74  Temp(Src) 98.1 F (36.7 C) (Oral)  Resp 18  Ht 6\' 1"  (1.854 m)  Wt 218 lb 14.7 oz (99.3 kg)  BMI 28.89 kg/m2  SpO2 97% Patient with overdose. Awaiting family. I have [facility] who agrees with work up and asks for 12 hour obs.   11:56 AM Patient  With apparent overdose. I have spoken with the patient's family and they states that he has been misusing narcotics for some time and buying morphine off the street.  The patient did take an unknown amount of  long acting morphine. He had one episode of respirations dropping to 5/min and was given 1 g Narcan and 4 zofran. He became somewhat more arousable and has been protecting his airway. He apparently took xanax as well.   4:10 PM Patient protecting airway throughout the visit.  He appears to have no elevated tylenol level at this time. No elevationin his liver enzymes. No elevation oin salicylates. CBC/ CMP otherwise unremarkable. Will admit to stepdown unit for further management. Dr. Malachi Bonds is the acceptting physician.  Patient seen in shared visit with attending physician. I personally reviewed the imaging tests through PACS system. I have reviewed and interpreted Lab values. I reviewed available ER/hospitalization records through the EMR  Pt stable in ED with no significant deterioration in condition.   Arthor Captain, PA-C 07/03/14 1156

## 2014-06-30 NOTE — ED Notes (Signed)
EMS reports pt recently had back surgery, pt prescribed Oxycodone 60 tabs, and only 4 left, pt also taken morphine unknown amt and dosage that was not his, pts wife did not know he had taken this medication therefore gave him Tylenol PM. Pt lethargic, responds to verbal.

## 2014-06-30 NOTE — ED Notes (Addendum)
No urine in catheter bag. Will check in the next few minutes.

## 2014-06-30 NOTE — ED Notes (Addendum)
CBG 93 

## 2014-06-30 NOTE — ED Notes (Signed)
Patient presents to the ED from home with complaints of unintentional overdose.    On exam, patients lung sounds are coarse.  Heart sounds WNL, S1/S2.  +2 radial and pedal pulses.  No pre-tibial and pedal edema.  Patients abdomen is soft.  Bowel sounds are hypoactive. Pupils are responsive 4mm.  Skin is cool/dry/intact.

## 2014-07-01 DIAGNOSIS — T50904D Poisoning by unspecified drugs, medicaments and biological substances, undetermined, subsequent encounter: Secondary | ICD-10-CM | POA: Diagnosis not present

## 2014-07-01 DIAGNOSIS — R338 Other retention of urine: Secondary | ICD-10-CM

## 2014-07-01 LAB — BASIC METABOLIC PANEL
Anion gap: 8 (ref 5–15)
BUN: 12 mg/dL (ref 6–23)
CO2: 29 mmol/L (ref 19–32)
Calcium: 9.3 mg/dL (ref 8.4–10.5)
Chloride: 104 mmol/L (ref 96–112)
Creatinine, Ser: 1 mg/dL (ref 0.50–1.35)
GFR calc Af Amer: >90 mL/min (ref >90)
GFR calc non Af Amer: >90 mL/min (ref >90)
Glucose, Bld: 108 mg/dL — ABNORMAL HIGH (ref 70–99)
Potassium: 4.4 mmol/L (ref 3.5–5.1)
Sodium: 141 mmol/L (ref 135–145)

## 2014-07-01 LAB — MRSA PCR SCREENING: MRSA BY PCR: NEGATIVE

## 2014-07-01 MED ORDER — OXYCODONE HCL 5 MG PO TABS
ORAL_TABLET | ORAL | Status: AC
  Filled 2014-07-01: qty 1

## 2014-07-01 MED ORDER — OXYCODONE HCL 5 MG PO TABS
5.0000 mg | ORAL_TABLET | ORAL | Status: DC | PRN
Start: 2014-07-01 — End: 2014-07-02
  Administered 2014-07-01 – 2014-07-02 (×4): 5 mg via ORAL
  Filled 2014-07-01 (×3): qty 1

## 2014-07-01 MED ORDER — OXYCODONE-ACETAMINOPHEN 5-325 MG PO TABS
1.0000 | ORAL_TABLET | ORAL | Status: DC | PRN
  Administered 2014-07-02 (×3): 1 via ORAL
  Filled 2014-07-01 (×3): qty 1

## 2014-07-01 MED ORDER — DIAZEPAM 2 MG PO TABS: 2.0000 mg | ORAL_TABLET | Freq: Four times a day (QID) | ORAL | Status: DC | PRN

## 2014-07-01 MED ORDER — POLYMYXIN B-TRIMETHOPRIM 10000-0.1 UNIT/ML-% OP SOLN
1.0000 [drp] | OPHTHALMIC | Status: DC
  Administered 2014-07-01 – 2014-07-03 (×12): 1 [drp] via OPHTHALMIC
  Filled 2014-07-01: qty 10

## 2014-07-01 MED ORDER — NALOXONE HCL 0.4 MG/ML IJ SOLN: 0.4000 mg | INTRAMUSCULAR | Status: DC | PRN

## 2014-07-01 MED ORDER — SERTRALINE HCL 25 MG PO TABS
25.0000 mg | ORAL_TABLET | Freq: Every day | ORAL | Status: DC
  Administered 2014-07-01: 25 mg via ORAL
  Filled 2014-07-01: qty 1

## 2014-07-01 MED ORDER — SERTRALINE HCL 50 MG PO TABS
ORAL_TABLET | ORAL | Status: AC
  Filled 2014-07-01: qty 1

## 2014-07-01 MED ORDER — LIDOCAINE 5 % EX PTCH
2.0000 | MEDICATED_PATCH | Freq: Every evening | CUTANEOUS | Status: DC
  Administered 2014-07-01 – 2014-07-02 (×2): 2 via TRANSDERMAL
  Filled 2014-07-01 (×3): qty 2

## 2014-07-01 MED ORDER — PREGABALIN 75 MG PO CAPS
ORAL_CAPSULE | ORAL | Status: AC
  Filled 2014-07-01: qty 2

## 2014-07-01 MED ORDER — PREGABALIN 75 MG PO CAPS
150.0000 mg | ORAL_CAPSULE | Freq: Two times a day (BID) | ORAL | Status: DC
  Administered 2014-07-01 – 2014-07-03 (×4): 150 mg via ORAL
  Filled 2014-07-01 (×3): qty 2

## 2014-07-01 MED ORDER — MELOXICAM 15 MG PO TABS
15.0000 mg | ORAL_TABLET | Freq: Every day | ORAL | Status: DC
  Administered 2014-07-01 – 2014-07-03 (×3): 15 mg via ORAL
  Filled 2014-07-01 (×3): qty 1

## 2014-07-01 MED ORDER — CLONIDINE HCL 0.1 MG PO TABS
0.1000 mg | ORAL_TABLET | Freq: Two times a day (BID) | ORAL | Status: DC
  Administered 2014-07-02 – 2014-07-03 (×3): 0.1 mg via ORAL
  Filled 2014-07-01 (×5): qty 1

## 2014-07-01 NOTE — Progress Notes (Addendum)
[facility] PROGRESS NOTE  Ryan Boyd ZOX:[id number] DOB: May 17, 1980 DOA: 06/30/2014 PCP: Deberah Castle, MD  Brief Summary The patient is a 34 y.o. year-old male with history of chronic low back pain who presents with drug overdose. History obtained from family and chart as patient not able to give history due to sedation. After an injury in April 2013, he developed debilitating back pain. He failed medical management and underwent diskectomy and decompression with lumbar fusion in 06/2013. He continued to have severe pain despite surgery. He underwent neurostimilator placement on 3/11 (day prior to admission). Post-procedure he took excessive medication and was found by his wife unresponsive on the bed. When EMS arrived, he was cyanotic but recovered with oxygen. According to the family, he was prescribed Percocet 10/325 mg tablets #90 on 3/2 and only had 4 left in the bottle. He was also prescribed Robaxin 500 mg #90 on 2/3 and only had 13 tablets left. He had a bag with Xanax 2 mg and morphine 30 mg extended release that he does not have a prescription for. The patient reported that he took two doses of valium and some xanax. He also received a dose of Tylenol p.m the night before. He required a dose of narcan in the emergency department and again the first night of admission.  Assessment/Plan  Overdose on prescription narcotics and possibly benzodiazepines, suspect unintentional, but unable to obtain history. Per family, patient may have some depression and they are requesting psychiatry consultation. - Continue stepdown - Continuous pulse ox/CO2 monitoring - Narcan prn - NPO with IVF - Hold all narcotics and sedating medications - Please call psychiatry consultation after patient awakes - Sitter at bedside - Suicide precautions - Followed by and has pain contract with Dr. Ethelene Hal - plan to call him on Monday to discuss pain management.   May have  opioid-induced constipation - Bisacodyl suppositories prn until patient alert  Acute urinary retention secondary to narcotic overdose  - continue foley until more alert  Acute conjunctivitis, right eye -  polytrim eye gtt, started 3/13  Diet: NPO Access: PIV IVF: yes Proph: lovenox  Code Status: FULL Family Communication: patient, his wife, and his mother Disposition Plan:  Continue in stepdown  Consultants:  none  Procedures:  CXR  Antibiotics:  none   HPI/Subjective:  Patient still asleep, denies discomfort.  Received one dose of narcan overnight    Objective: Filed Vitals:   07/01/14 0100 07/01/14 0300 07/01/14 0400 07/01/14 0500  BP: 106/71 112/70 104/72 99/62  Pulse: 86 85 82 81  Temp:   98.2 F (36.8 C)   TempSrc:   Oral   Resp: Weight:    101 kg (222 lb 10.6 oz)  SpO2: 100% 99% 99% 99%    Intake/Output Summary (Last 24 hours) at 07/01/14 0710 Last data filed at 07/01/14 0500  Gross per 24 hour  Intake    900 ml  Output   2500 ml  Net  -1600 ml   Filed Weights   07/01/14 0500  Weight: 101 kg (222 lb 10.6 oz)    Exam:   General:  Adult male, No acute distress  HEENT:  NCAT, MMM  Cardiovascular:  RRR, nl S1, S2 no mrg, 2+ pulses, warm extremities  Respiratory:  Course rales at bilateral bases, no rhonchi or wheezing, no increased WOB  Abdomen:   NABS, soft, NT/ND, bladder no longer feels distended  MSK:   Normal tone and bulk, 1+ diffuse nonpitting  edema  Neuro:  Grossly moves all extremities, however, sleepy.  Arouses to voice briefly and can follow some commands but then falls back to sleep   Data Reviewed: Basic Metabolic Panel:  Recent Labs Lab 06/30/14 1433 06/30/14 1439  NA 141 142  K 4.4 4.3  CL 104 101  CO2 28  --   GLUCOSE 101* 98  BUN 15 16  CREATININE 1.00 1.10  CALCIUM 9.4  --    Liver Function Tests:  Recent Labs Lab 06/30/14 1433  AST 32  ALT 30  ALKPHOS 68  BILITOT 0.3  PROT 7.0   ALBUMIN 4.0   No results for input(s): LIPASE, AMYLASE in the last 168 hours. No results for input(s): AMMONIA in the last 168 hours. CBC:  Recent Labs Lab 06/30/14 1433 06/30/14 1439  WBC 8.4  --   NEUTROABS 3.7  --   HGB 14.7 15.6  HCT 44.3 46.0  MCV 98.0  --   PLT 199  --    Cardiac Enzymes: No results for input(s): CKTOTAL, CKMB, CKMBINDEX, TROPONINI in the last 168 hours. BNP (last 3 results) No results for input(s): BNP in the last 8760 hours.  ProBNP (last 3 results) No results for input(s): PROBNP in the last 8760 hours.  CBG:  Recent Labs Lab 06/30/14 1409  GLUCAP 93    Recent Results (from the past 240 hour(s))  MRSA PCR Screening     Status: None   Collection Time: 06/30/14  6:58 PM  Result Value Ref Range Status   MRSA by PCR NEGATIVE NEGATIVE Final    Comment:        The GeneXpert MRSA Assay (FDA approved for NASAL specimens only), is one component of a comprehensive MRSA colonization surveillance program. It is not intended to diagnose MRSA infection nor to guide or monitor treatment for MRSA infections. Performed at [facility]      Studies: Dg Chest Port 1 View  06/30/2014   CLINICAL DATA:  Overdose.  EXAM: CHEST  2 VIEW  COMPARISON:  None.  FINDINGS: Borderline enlarged cardiac silhouette and mediastinal contours, potentially accentuated due to decreased lung volumes supine patient positioning. There is mild elevation of the right hemidiaphragm. Minimal right basilar linear heterogeneous opacities. Pulmonary venous congestion without frank evidence of edema. No supine evidence of pneumothorax or pleural effusion. No evidence of edema. No acute osseus abnormalities. A spinal stimulator device overlies the lower thoracic spine.  IMPRESSION: Decreased lung volumes with right basilar heterogeneous opacities, possibly atelectasis though infection and/or aspiration could have a similar appearance. Further evaluation with a PA and lateral  chest radiograph may be obtained as clinically indicated.   Electronically Signed   By: Simonne ComeJohn  Watts M.D.   On: 06/30/2014 14:44    Scheduled Meds: . enoxaparin (LOVENOX) injection  40 mg Subcutaneous Q24H  . sodium chloride  3 mL Intravenous Q12H   Continuous Infusions: . dextrose 5 % and 0.45 % NaCl with KCl 20 mEq/L 100 mL (06/30/14 2016)    Active Problems:   Overdose    Time spent: 30 min    Ryan Boyd, [facility]  [facility] Pager [phone]. If 7PM-7AM, please contact night-coverage at [email or web address], password [email or web address] 07/01/2014, 7:10 AM  LOS: 1 day

## 2014-07-01 NOTE — Progress Notes (Signed)
07/01/14 Poison Control called to get update on patient, they will call back this afternoon for an update. Current vitals were given to poison control.

## 2014-07-02 DIAGNOSIS — T50904A Poisoning by unspecified drugs, medicaments and biological substances, undetermined, initial encounter: Secondary | ICD-10-CM | POA: Diagnosis not present

## 2014-07-02 DIAGNOSIS — R338 Other retention of urine: Secondary | ICD-10-CM

## 2014-07-02 DIAGNOSIS — F4323 Adjustment disorder with mixed anxiety and depressed mood: Secondary | ICD-10-CM

## 2014-07-02 DIAGNOSIS — M5440 Lumbago with sciatica, unspecified side: Secondary | ICD-10-CM

## 2014-07-02 LAB — URINE CULTURE
Colony Count: NO GROWTH
Culture: NO GROWTH

## 2014-07-02 MED ORDER — SERTRALINE HCL 50 MG PO TABS
50.0000 mg | ORAL_TABLET | Freq: Every day | ORAL | Status: DC
  Administered 2014-07-02: 50 mg via ORAL
  Filled 2014-07-02 (×3): qty 1

## 2014-07-02 MED ORDER — POLYETHYLENE GLYCOL 3350 17 G PO PACK: 17.0000 g | PACK | Freq: Every day | ORAL | Status: DC | PRN

## 2014-07-02 MED ORDER — OXYCODONE HCL 5 MG PO TABS
5.0000 mg | ORAL_TABLET | Freq: Three times a day (TID) | ORAL | Status: DC
  Administered 2014-07-02 – 2014-07-03 (×2): 5 mg via ORAL
  Filled 2014-07-02 (×2): qty 1

## 2014-07-02 MED ORDER — BISACODYL 5 MG PO TBEC: 5.0000 mg | DELAYED_RELEASE_TABLET | Freq: Every day | ORAL | Status: DC | PRN

## 2014-07-02 MED ORDER — OXYCODONE-ACETAMINOPHEN 5-325 MG PO TABS
1.0000 | ORAL_TABLET | Freq: Three times a day (TID) | ORAL | Status: DC | PRN
  Administered 2014-07-03: 1 via ORAL
  Filled 2014-07-02: qty 1

## 2014-07-02 NOTE — Consult Note (Signed)
[facility] Face-to-Face Psychiatry Consult   Reason for Consult:  Intentional drug overdose and chronic back pain Referring Physician:  Dr. Malachi Bonds Patient Identification: Ryan Boyd MRN:  [id number] Principal Diagnosis: Adjustment disorder with mixed anxiety and depressed mood Diagnosis:   Patient Active Problem List   Diagnosis Date Noted  . Acute urinary retention [R33.8] 07/01/2014  . Overdose [T50.901A] 06/30/2014  . Ileus, postoperative [K91.3] 06/30/2013  . Tachycardia [R00.0] 06/30/2013  . 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 Nausea with vomiting [R11.2] 06/30/2013  . Back pain [M54.9] 06/29/2013    Total Time spent with patient: 45 minutes  Subjective:   Ryan Boyd is a 34 y.o. male patient admitted with intentional drug overdose.  HPI:  Ryan Boyd is a 34 y.o. year-old male seen and chart reviewed for psych consultation and evaluation of intentional overdose and chronic low back pain. 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 Patient has been frustrated with workman comp and managing his back pain. He took about three pills of morphine and two xanax  from a friend in the span of seven hours before he is able to relax and sleep. Patient stated that he does not like taking pills and not craving and wants to stay away if he can. Patient family is not aware of what he was taken but concerned that if they can not wake him up. Patient stated that his pain doctors gave him a trial of cymbalta and gabapentin without much help and cymbalta made him feel more jittery. Patient states that he is not feeling depressed or anxious. He has denied suicide or homicide ideation, intention or plans. He has no evidence of psychosis. He has supportive family, wife and three young children who he cares for. He is able to  contract for safety. He has no previous psych admission. Patient UDS is positive for benzo's and opioids. He has no history of drug or alcohol abuse.   Medical history: Patient with history of chronic low back pain who presents with drug overdose. History obtained from family and chart as patient not able to give history due to sedation. After an injury in April 2013, he developed debilitating back pain. He failed medical management and underwent diskectomy and decompression with lumbar fusion in 06/2013. He continued to have severe pain despite surgery. He underwent neurostimilator placement on 3/11 (day prior to admission). Post-procedure he took excessive medication and was found by his wife unresponsive on the bed. His wife called the patient's mother who is a nurse who came over right away and discovered that he was very difficult to arouse and had decreased respirations. They called EMS. When EMS arrived, he was cyanotic but recovered with oxygen. According to the family, he was prescribed Percocet 10/325 mg tablets #90 on 3/2 and only had 4 left in the bottle. He was also prescribed Robaxin 500 mg #90 on 2/3 and only had 13 tablets left. He had a bag with Xanax 2 mg and morphine 30 mg extended release that he does not have a prescription for. The patient reported that he took two doses of valium and some xanax. Wife reported that he also received a dose of Tylenol p.m the night before.   In the ER, labs were stable. CXR demonstrated low lung volumes with atelectasis vs. Possible aspiration. He was given a dose  of narcan and zofran. After narcan, he awoke, but shortly thereafter he fell asleep again. He has been protecting his airway. Poison control recommended observation for sedation for at least 12 hours. He is being admitted to stepdown for continuous monitoring.   Review of Systems: Unable to obtain, patient sedated.   HPI Elements:   Location:  adjustment problem and  intentional overdose. Quality:  fair to poor. Severity:  chronic . Timing:  frustrated after neurostimulator placement. Duration:  few days. Context:  Dibilitating low back pain.  Past Medical History:  Past Medical History  Diagnosis Date  . Back pain, chronic     Past Surgical History  Procedure Laterality Date  . Lumbar disc surgery    . Tonsillectomy    . Anterior lat lumbar fusion N/A 06/29/2013    Procedure: ANTERIOR LUMBAR INTERBODY FUSION Ll5-S1   (ALIF) 1 LEVEL;  Surgeon: Venita Lick, MD;  Location: [facility] OR;  Service: Orthopedics;  Laterality: N/A;  . Abdominal exposure N/A 06/29/2013    Procedure: ABDOMINAL EXPOSURE;  Surgeon: Nada Libman, MD;  Location: [facility] OR;  Service: Vascular;  Laterality: N/A;   Family History:  Family History  Problem Relation Age of Onset  . Alcoholism Father   . Depression Neg Hx   . Anxiety disorder Neg Hx    Social History:  History  Alcohol Use No     History  Drug Use No    History   Social History  . Marital Status: Married    Spouse Name: N/A  . Number of Children: N/A  . Years of Education: N/A   Social History Main Topics  . Smoking status: Never Smoker   . Smokeless tobacco: Not on file  . Alcohol Use: No  . Drug Use: No  . Sexual Activity: Not on file   Other Topics Concern  . None   Social History Narrative   Additional Social History:      Allergies:   Allergies  Allergen Reactions  . Erythromycin Other (See Comments)    Eye swelling (occured as a baby)  . Other Other (See Comments)    Reaction:welts HAS REACTION WITH ANY TYPE OF STEROIDS  . Prednisone     Reaction:welts all over.     Vitals: Blood pressure 107/67, pulse 89, temperature 97.8 F (36.6 C), temperature source Oral, resp. rate 14, height  (1.854 m), weight 99.3 kg (218 lb 14.7 oz), SpO2 96 %.  Risk to Self: Is patient at risk for suicide?: No (pt denied sucide ideation) Risk to Others:   Prior Inpatient Therapy:   Prior  Outpatient Therapy:    Current Facility-Administered Medications  Medication Dose Route Frequency Provider Last Rate Last Dose  . bisacodyl (DULCOLAX) suppository 10 mg  10 mg Rectal Daily PRN Renae Fickle, MD      . cloNIDine (CATAPRES) tablet 0.1 mg  0.1 mg Oral BID Renae Fickle, MD   0.1 mg at 07/01/14 1749  . diazepam (VALIUM) tablet 2 mg  2 mg Oral Q6H PRN Renae Fickle, MD      . enoxaparin (LOVENOX) injection 40 mg  40 mg Subcutaneous Q24H Renae Fickle, MD   40 mg at 07/01/14 2137  . lidocaine (LIDODERM) 5 % 2 patch  2 patch Transdermal QPM Renae Fickle, MD   2 patch at 07/01/14 1610  . meloxicam (MOBIC) tablet 15 mg  15 mg Oral Daily Renae Fickle, MD   15 mg at 07/01/14 1610  . naloxone [facility]) injection 0.4  mg  0.4 mg Intravenous PRN Renae FickleMackenzie Short, MD      . ondansetron [facility](ZOFRAN) tablet 4 mg  4 mg Oral Q6H PRN Renae FickleMackenzie Short, MD       Or  . ondansetron (ZOFRAN) injection 4 mg  4 mg Intravenous Q6H PRN Renae FickleMackenzie Short, MD      . oxyCODONE-acetaminophen (PERCOCET/ROXICET) 5-325 MG per tablet 1 tablet  1 tablet Oral Q4H PRN Renae FickleMackenzie Short, MD   1 tablet at 07/02/14 0830   And  . oxyCODONE (Oxy IR/ROXICODONE) immediate release tablet 5 mg  5 mg Oral Q4H PRN Renae FickleMackenzie Short, MD   5 mg at 07/02/14 0829  . pregabalin (LYRICA) capsule 150 mg  150 mg Oral BID Renae FickleMackenzie Short, MD   150 mg at 07/01/14 2138  . sertraline (ZOLOFT) tablet 25 mg  25 mg Oral QHS Renae FickleMackenzie Short, MD   25 mg at 07/01/14 2138  . sodium chloride 0.9 % injection 3 mL  3 mL Intravenous Q12H Renae FickleMackenzie Short, MD   3 mL at 07/01/14 2138  . trimethoprim-polymyxin b (POLYTRIM) ophthalmic solution 1 drop  1 drop Right Eye 6 times per day Renae FickleMackenzie Short, MD   1 drop at 07/02/14 0829    Musculoskeletal: Strength & Muscle Tone: decreased Gait & Station: unable to stand Patient leans: N/A  Psychiatric Specialty Exam: Physical Exam as per history and physical  ROS chronic low back pain, frustration, and  overdose  Blood pressure 107/67, pulse 89, temperature 97.8 F (36.6 C), temperature source Oral, resp. rate 14, height 6\' 1"  (1.854 m), weight 99.3 kg (218 lb 14.7 oz), SpO2 96 %.Body mass index is 28.89 kg/(m^2).  General Appearance: Casual  Eye Contact::  Good  Speech:  Clear and Coherent  Volume:  Decreased  Mood:  Anxious and Depressed  Affect:  Tearful  Thought Process:  Coherent and Goal Directed  Orientation:  Full (Time, Place, and Person)  Thought Content:  WDL  Suicidal Thoughts:  No  Homicidal Thoughts:  No  Memory:  Immediate;   Good Recent;   Good  Judgement:  Intact  Insight:  Fair  Psychomotor Activity:  Decreased  Concentration:  Good  Recall:  Good  Fund of Knowledge:Good  Language: Good  Akathisia:  Negative  Handed:  Right  AIMS (if indicated):     Assets:  Communication Skills Desire for Improvement Financial Resources/Insurance Housing Intimacy Leisure Time Resilience Social Support Talents/Skills Transportation  ADL's:  Impaired  Cognition: WNL  Sleep:      Medical Decision Making: Review of Psycho-Social Stressors (1), Review or order clinical lab tests (1), Established Problem, Worsening (2), Review or order medicine tests (1), Review of Medication Regimen & Side Effects (2) and Review of New Medication or Change in Dosage (2)  Treatment Plan Summary: Daily contact with patient to assess and evaluate symptoms and progress in treatment and Medication management  Plan: Discontinue sitter as he is not suicidal and contract for safety Patient does not meet criteria for psychiatric inpatient admission. Supportive therapy provided about ongoing stressors.  Appreciate psychiatric consultation and follow up as clinically required Please contact [phone] or [phone] if needs further assistance  Disposition: patient will be referred to out patient psychiatric treatment when medically stable  Tavaris Eudy,JANARDHAHA R. 07/02/2014 8:58 AM

## 2014-07-02 NOTE — Progress Notes (Signed)
[facility] PROGRESS NOTE  Korbin Mapps ZOX:[id number] DOB: 16-Aug-1980 DOA: 06/30/2014 PCP: Deberah Castle, MD  Brief Summary The patient is a 34 y.o. year-old male with history of chronic low back pain who presents with drug overdose. History obtained from family and chart as patient not able to give history due to sedation. After an injury in April 2013, he developed debilitating back pain. He failed medical management and underwent diskectomy and decompression with lumbar fusion in 06/2013. He continued to have severe pain despite surgery. He underwent neurostimilator placement on 3/11 (day prior to admission). Post-procedure he took excessive medication and was found by his wife unresponsive on the bed. When EMS arrived, he was cyanotic but recovered with oxygen. According to the family, he was prescribed Percocet 10/325 mg tablets #90 on 3/2 and only had 4 left in the bottle. He was also prescribed Robaxin 500 mg #90 on 2/3 and only had 13 tablets left. He had a bag with Xanax 2 mg and morphine 30 mg extended release that he does not have a prescription for. The patient reported that he took two doses of valium and some xanax. He also received a dose of Tylenol p.m the night before. He required a dose of narcan in the emergency department and again the first night of admission.  He has recovered from his oversedation.    Assessment/Plan  Overdose on prescription narcotics and possibly benzodiazepines.  Patient has been depressed due to debilitation and pain.  After his procedure, he states he was given some medication by a friend which he took.  Does not seem that he was intentionally trying to kill himself but passively did not seem to care if he took too much.  Family requesting psychiatry consultation. -  Started clonidine -  Resumed lyrica -  Continue percocet dosed at his home dose as needed (do NOT escalate) - Psychiatry consultation - Sitter at bedside  pending psychiatry consultation - Suicide precautions -  Started zoloft on 3/13 pending psychiatry consultation -  Patient failed cymbalta previously  -  Lidocaine patches are helping -  Continue Valium when necessary, lower dose then the patient was admitted on -  Recommend continuing to try to taper his benzodiazepines given his drug overdose -  Have called and left a message for his pain specialist, awaiting a call back  Denies opioid-induced constipation - MiraLAX and bisacodyl when necessary  Acute urinary retention secondary to narcotic overdose, resolved - Voiding spontaneously without difficulty  Acute conjunctivitis, right eye -  polytrim eye gtt, started 3/13 5 days  Diet: Regular Access: PIV IVF: Off Proph: lovenox  Code Status: FULL Family Communication: patient, his wife, and his mother Disposition Plan:  Transfer to [facility]  Consultants:  none  Procedures:  CXR  Antibiotics:  none   HPI/Subjective:  Patient awake, conversant, able to answer questions appropriately. Has severe 8 out of 10 back pain with chronic lower extremity weakness. Denies shortness of breath, chest pain, nausea, shakes, irritability, anxiety. Denies constipation or diarrhea.  Objective: Filed Vitals:   07/02/14 0510 07/02/14 0600 07/02/14 0802 07/02/14 0830  BP:  107/67    Pulse:  78  89  Temp:   97.8 F (36.6 C)   TempSrc:   Oral   Resp:  13  14  Height:      Weight: 99.3 kg (218 lb 14.7 oz)     SpO2:  94%  96%    Intake/Output Summary (Last 24 hours) at 07/02/14 0920 Last  data filed at 07/02/14 0817  Gross per 24 hour  Intake    723 ml  Output   1025 ml  Net   -302 ml   Filed Weights   07/01/14 0500 07/02/14 0510  Weight: 101 kg (222 lb 10.6 oz) 99.3 kg (218 lb 14.7 oz)    Exam:   General:  Adult male, No acute distress  HEENT:  NCAT, MMM  Cardiovascular:  RRR, nl S1, S2 no mrg, 2+ pulses, warm extremities  Respiratory:  Clear to auscultation  bilaterally, no increased WOB  Abdomen:   NABS, soft, NT/ND, bladder no longer feels distended  MSK:   Normal tone and bulk, 1+ diffuse nonpitting edema  Neuro:  4-5 bilateral lower extremity hip flexion, knee flexion extension, ankle dorsi and plantar flexion, 2+ bilateral patellar reflexes, sensation intact to light touch    Data Reviewed: Basic Metabolic Panel:  Recent Labs Lab 06/30/14 1433 06/30/14 1439 07/01/14 0652  NA 141 142 141  K 4.4 4.3 4.4  CL 104 101 104  CO2 28  --  29  GLUCOSE 101* 98 108*  BUN CREATININE 1.00 1.10 1.00  CALCIUM 9.4  --  9.3   Liver Function Tests:  Recent Labs Lab 06/30/14 1433  AST 32  ALT 30  ALKPHOS 68  BILITOT 0.3  PROT 7.0  ALBUMIN 4.0   No results for input(s): LIPASE, AMYLASE in the last 168 hours. No results for input(s): AMMONIA in the last 168 hours. CBC:  Recent Labs Lab 06/30/14 1433 06/30/14 1439  WBC 8.4  --   NEUTROABS 3.7  --   HGB 14.7 15.6  HCT 44.3 46.0  MCV 98.0  --   PLT 199  --    Cardiac Enzymes: No results for input(s): CKTOTAL, CKMB, CKMBINDEX, TROPONINI in the last 168 hours. BNP (last 3 results) No results for input(s): BNP in the last 8760 hours.  ProBNP (last 3 results) No results for input(s): PROBNP in the last 8760 hours.  CBG:  Recent Labs Lab 06/30/14 1409  GLUCAP 93    Recent Results (from the past 240 hour(s))  MRSA PCR Screening     Status: None   Collection Time: 06/30/14  6:58 PM  Result Value Ref Range Status   MRSA by PCR NEGATIVE NEGATIVE Final    Comment:        The GeneXpert MRSA Assay (FDA approved for NASAL specimens only), is one component of a comprehensive MRSA colonization surveillance program. It is not intended to diagnose MRSA infection nor to guide or monitor treatment for MRSA infections. Performed at [facility]      Studies: Dg Chest Port 1 View  06/30/2014   CLINICAL DATA:  Overdose.  EXAM: CHEST  2 VIEW  COMPARISON:   None.  FINDINGS: Borderline enlarged cardiac silhouette and mediastinal contours, potentially accentuated due to decreased lung volumes supine patient positioning. There is mild elevation of the right hemidiaphragm. Minimal right basilar linear heterogeneous opacities. Pulmonary venous congestion without frank evidence of edema. No supine evidence of pneumothorax or pleural effusion. No evidence of edema. No acute osseus abnormalities. A spinal stimulator device overlies the lower thoracic spine.  IMPRESSION: Decreased lung volumes with right basilar heterogeneous opacities, possibly atelectasis though infection and/or aspiration could have a similar appearance. Further evaluation with a PA and lateral chest radiograph may be obtained as clinically indicated.   Electronically Signed   By: Simonne Come M.D.   On: 06/30/2014 14:44  Scheduled Meds: . cloNIDine  0.1 mg Oral BID  . enoxaparin (LOVENOX) injection  40 mg Subcutaneous Q24H  . lidocaine  2 patch Transdermal QPM  . meloxicam  15 mg Oral Daily  . pregabalin  150 mg Oral BID  . sertraline  25 mg Oral QHS  . sodium chloride  3 mL Intravenous Q12H  . trimethoprim-polymyxin b  1 drop Right Eye 6 times per day   Continuous Infusions:    Principal Problem:   Overdose Active Problems:   Back pain   Acute urinary retention    Time spent: 30 min    Darien Kading, [facility] HospitalMACKENZIE  [facility] Pager [phone]. If 7PM-7AM, please contact night-coverage at [email or web address], password [email or web address] 07/02/2014, 9:20 AM  LOS: 2 days

## 2014-07-02 NOTE — Clinical Social Work Psych Assess (Signed)
Clinical Social Work Department CLINICAL SOCIAL WORK PSYCHIATRY SERVICE LINE ASSESSMENT 07/02/2014  Patient:  Ryan Boyd  Account:  [id number]  Admit Date:  06/30/2014  Clinical Social Worker:  Daiva Huge  Date/Time:  07/02/2014 12:15 PM Referred by:  Physician  Date referred:  07/02/2014 Reason for Referral  Other - See comment   Presenting Symptoms/Problems (In the person's/family's own words):   Patient admitted for overdose- he reports this was accidental.   Abuse/Neglect/Trauma History (check all that apply)  Denies history   Abuse/Neglect/Trauma Comments:   Psychiatric History (check all that apply)  Denies history   Psychiatric medications:  Current Mental Health Hospitalizations/Previous Mental Health History:   denies   Current provider:   Place and Date:   Current Medications:   Previous Impatient Admission/Date/Reason:   Emotional Health / Current Symptoms    Suicide/Self Harm  None reported   Suicide attempt in the past:   denies   Other harmful behavior:   Psychotic/Dissociative Symptoms  None reported   Other Psychotic/Dissociative Symptoms:    Attention/Behavioral Symptoms  Other - See comment   Other Attention / Behavioral Symptoms:   chronic pain causing increased frustration and inability to rest/focus    Cognitive Impairment  Within Normal Limits   Other Cognitive Impairment:    Mood and Adjustment  Unable to accurately assess    Stress, Anxiety, Trauma, Any Recent Loss/Stressor  Other - See comment   Anxiety (frequency):   Patient appears to have anxiety with his chronic pain- he is quite frustrated with the workers comp delays as well as his community MD who have not been able to aide him in decreasing his pain.   Phobia (specify):   Compulsive behavior (specify):   Obsessive behavior (specify):   Other:   Patient voices a significant feeling of frustration and feeling overwhelmed   Substance Abuse/Use   None   SBIRT completed (please refer for detailed history):    Self-reported substance use:   Urinary Drug Screen Completed:   Alcohol level:    Environmental/Housing/Living Arrangement  Stable housing  With Other Relatives   Who is in the home:   Wife nad 3 children ages 33, 86 and 4   Emergency contact:  wife and mother   Product manager   Patient's Strengths and Goals (patient's own words):   Patient reports good support from his wife and extended family including his  mother who is a Therapist, sports at [employer]. 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 Patient reports being injured at work back a few years ago- he has had several surgeries- including one last week. He reports struggling with workers comp and his community based MD to get his pain under control- "they dont' address it". He was in such excruciating pain that he accepted some pills a friend had to try to relieve his pain- "someone I know offered me some morphine and xanax to try to reduce my pain".  He appears very remorseful and states, " I was in no way trying to hurt or kill my self. I have 3 children and wife I love dearly".  Patient shared with CSW and Psychiatrist that he was at "wit's end" with his pain and being put off by workers comp and the pain clinic.  CSW provided support and encouraged him to speak with his  workers comp rep as well as his lawyer to advocate for his pain and treatment needs to be addressed. He is tearful and appears to understand the concerns related to him taking meds that were not prescribed to him.   Disposition:  Recommend Psych CSW continuing to support while in hospital Eduard Clos, MSW, LCSWA

## 2014-07-03 DIAGNOSIS — T50901D Poisoning by unspecified drugs, medicaments and biological substances, accidental (unintentional), subsequent encounter: Secondary | ICD-10-CM | POA: Diagnosis not present

## 2014-07-03 DIAGNOSIS — F4323 Adjustment disorder with mixed anxiety and depressed mood: Secondary | ICD-10-CM | POA: Diagnosis not present

## 2014-07-03 DIAGNOSIS — R338 Other retention of urine: Secondary | ICD-10-CM | POA: Diagnosis not present

## 2014-07-03 MED ORDER — SERTRALINE HCL 100 MG PO TABS
ORAL_TABLET | ORAL | Status: DC
Start: 2014-07-03 — End: 2014-10-10

## 2014-07-03 MED ORDER — LIDOCAINE 5 % EX PTCH: 2.0000 | MEDICATED_PATCH | Freq: Every evening | CUTANEOUS | Status: DC

## 2014-07-03 MED ORDER — CLONIDINE HCL 0.1 MG PO TABS: 0.1000 mg | ORAL_TABLET | Freq: Two times a day (BID) | ORAL | Status: DC

## 2014-07-03 MED ORDER — MELOXICAM 15 MG PO TABS: 15.0000 mg | ORAL_TABLET | Freq: Every day | ORAL | Status: DC

## 2014-07-03 NOTE — Discharge Summary (Signed)
Physician Discharge Summary  Ryan Boyd VWU:[id number] DOB: 1981/03/27 DOA: 06/30/2014  PCP: Deberah Castle, MD  Admit date: 06/30/2014 Discharge date: 07/03/2014  Recommendations for Outpatient Follow-up:  1. Patient advised to show up to [facility] at 5:30AM on 3/16 to fill out intake paperwork and get enrolled in methadone clinic.   2. Follow up with Dr. Ethelene Hal on Friday.  Dr. Ethelene Hal to please discuss Lyrica, lidocaine patches, clonidine, zoloft, and other alternative pain medications.  Consider increasing his zoloft to  daily in 2 weeks and continuing his prescription.  He needs strict supervision of his lyrica.    Discharge Diagnoses:  Principal Problem:   Adjustment disorder with mixed anxiety and depressed mood Active Problems:   Back pain   Overdose   Acute urinary retention   Discharge Condition: stable, improved   Diet recommendation: regular  Wt Readings from Last 3 Encounters:  07/02/14 99.3 kg (218 lb 14.7 oz)  03/07/13 104.923 kg (231 lb 5 oz)    History of present illness:  The patient is a 34 y.o. year-old male with history of heroin abuse and chronic low back pain who presents with drug overdose. History was initially obtained from family and chart as patient was not able to give history due to sedation. He was a former heroin addict who had abstained from drugs for several years and become a Programmer, multimedia.  After a work-related injury in April 2013, he developed debilitating back pain. He failed medical management and underwent diskectomy and decompression with lumbar fusion in 06/2013. He continued to have severe pain despite surgery. He underwent neurostimilator placement on 3/11 (day prior to admission). Post-procedure he took excessive medication, including some medication he obtained from a friend, and was found by his wife unresponsive on the bed. When EMS arrived, he was cyanotic but recovered with oxygen. According to the  family, he was prescribed Percocet 10/325 mg tablets #90 on 3/2 and only had 4 left in the bottle. He was also prescribed Robaxin 500 mg #90 on 2/3 and only had 13 tablets left. He had a bag with Xanax 2 mg and morphine 30 mg extended release.  The patient reported that he took two doses of valium and some xanax. He also received a dose of Tylenol p.m the night before. He required a dose of narcan in the emergency department and again the first night of admission. He recovered from his oversedation. Per my interviews with him, he seems flat, tearful, and depressed.  He is not forthcoming with his symptoms or his history.  His family has been very concerned about him and multiple family members have contacted me directly to relay information and concerns.  I communicated with the patient that he is clearly tolerant to opiates and any doses that will relieve his pain will be so high that he will risk respiratory suppression.  Any "regular" doses will be ineffected and giving him prescriptions for Ryan Boyd-acting medications will only tempt him to use them not at prescribed and overdose again.  I recommended that he stop narcotics altogether.  He is at risk of reverting to heroin and his family is worried about this.  Recommended that he enroll in a methadone clinic to help him stay off heroin and to prevent accidental overdose.  I gave him and his family the information for [facility] who can see him on Wednesday or Thursday to enroll him.  He has not been prescribed benzos and has obtained them illegally.  He is  advised that these medications will void a pain contract should he continue to take them.  His wife states he abuses his lyrica also and runs out before his prescription is due, so I would be cautious with this medication also.  He should continue to work with Dr. Ethelene Halamos to find alternate treatments for his pain.  I have started him on zoloft and recommend continuing to titrate up the dose weekly until  dose is effective.    Hospital Course:   Overdose on prescription narcotics and possibly benzodiazepines. - Started clonidine - Resumed lyrica - Dr. Ethelene Halamos to please address this medication because he is apparently misusing this medication as well. - Patient has 4 tabs of percocet left at home to use until he can follow up with the methadone clinic in the morning. - Psychiatry deemed patient not a suicide risk. - Started zoloft on 3/13, recommend increasing dose to 100mg  in one week - Patient failed cymbalta previously  - Lidocaine patches are helping  Denied opioid-induced constipation - MiraLAX and bisacodyl when necessary  Acute urinary retention secondary to narcotic overdose requiring foley catheter for approximately 1 day, resolved - Voiding spontaneously without difficulty  Acute conjunctivitis, right eye - polytrim eye gtt, started 3/13, appears resolved.   Consultants:  Psychiatry  Procedures:  CXR  Antibiotics:  none  Discharge Exam: Filed Vitals:   07/03/14 0545  BP: 106/59  Pulse: 74  Temp: 98.1 F (36.7 C)  Resp: 18   Filed Vitals:   07/02/14 1500 07/02/14 1600 07/02/14 2148 07/03/14 0545  BP: 113/59 107/57 125/68 106/59  Pulse: 88 90 83 74  Temp:   98.3 F (36.8 C) 98.1 F (36.7 C)  TempSrc:   Oral Oral  Resp: 20 16 18 18   Height:      Weight:      SpO2: 97% 94% 98% 97%    General: Adult male, No acute distress, awake, alert  HEENT: NCAT, MMM  Cardiovascular: RRR, nl S1, S2 no mrg, 2+ pulses, warm extremities  Respiratory: Clear to auscultation bilaterally, no increased WOB   Discharge Instructions      Discharge Instructions    Call MD for:  difficulty breathing, headache or visual disturbances    Complete by:  As directed      Call MD for:  extreme fatigue    Complete by:  As directed      Call MD for:  hives    Complete by:  As directed      Call MD for:  persistant dizziness or light-headedness    Complete  by:  As directed      Call MD for:  persistant nausea and vomiting    Complete by:  As directed      Call MD for:  severe uncontrolled pain    Complete by:  As directed      Call MD for:  temperature >100.4    Complete by:  As directed      Diet general    Complete by:  As directed      Discharge instructions    Complete by:  As directed   You were hospitalized with a drug overdose.  Please start clonidine, lidocaine patches, mobic (mobic replaces ibuprofen) for pain.  Start zoloft, take a half tab per day for one week and then increase to a full tab per day.  Talk to Dr. Ethelene Halamos about extending this prescription.  Please stop your robaxin and take your lyrica only as prescribed.  Do NOT take valium, xanax, clonazepam, or ativan as these medications will void your contracts.  Show up to [facility] at 5:30AM tomorrow morning to fill out your intake paperwork and get set up for methadone.  They are not sure if you will start your methadone treatments on Wednesday or Thursday yet and will discuss this with you tomorrow morning, but either way, you should begin treatment no later than Thursday.     Increase activity slowly    Complete by:  As directed             Medication List    STOP taking these medications        diazepam 5 MG tablet  Commonly known as:  VALIUM     diphenhydramine-acetaminophen 25-500 MG Tabs  Commonly known as:  TYLENOL PM     ibuprofen 200 MG tablet  Commonly known as:  ADVIL,MOTRIN     methocarbamol 500 MG tablet  Commonly known as:  ROBAXIN      TAKE these medications        acetaminophen 500 MG tablet  Commonly known as:  TYLENOL  Take 1,000 mg by mouth every 6 (six) hours as needed for mild pain.     cloNIDine 0.1 MG tablet  Commonly known as:  CATAPRES  Take 1 tablet (0.1 mg total) by mouth 2 (two) times daily.     lidocaine 5 %  Commonly known as:  LIDODERM  Place 2 patches onto the skin every evening. Remove & Discard patch within 12  hours or as directed by MD     meloxicam 15 MG tablet  Commonly known as:  MOBIC  Take 1 tablet (15 mg total) by mouth daily.     oxyCODONE-acetaminophen 10-325 MG per tablet  Commonly known as:  PERCOCET  Take 1 tablet by mouth 3 (three) times daily as needed for pain.     pregabalin 75 MG capsule  Commonly known as:  LYRICA  Take 150 mg by mouth 2 (two) times daily.     sertraline 100 MG tablet  Commonly known as:  ZOLOFT  Take a half tab per day for next 7 days, then increase to a full tab thereafter.       Follow-up Information    Follow up with Deberah Castle, MD In 2 weeks.   Specialty:  Physical Medicine and Rehabilitation   Contact information:   [address] [place] [address] [phone]       Follow up with Boyd,Ryan D, MD On 07/06/2014.   Specialty:  Physical Medicine and Rehabilitation   Why:  already scheduled appointment   Contact information:   [address] [place] [address] [phone]       Follow up with [facility].   Why:  5:30 AM on Wednesday to fill out intake paperwork   Contact information:   [address]  [place], [place]  [address] Ph:  [phone]       The results of significant diagnostics from this hospitalization (including imaging, microbiology, ancillary and laboratory) are listed below for reference.    Significant Diagnostic Studies: Dg Chest Port 1 View  06/30/2014   CLINICAL DATA:  Overdose.  EXAM: CHEST  2 VIEW  COMPARISON:  None.  FINDINGS: Borderline enlarged cardiac silhouette and mediastinal contours, potentially accentuated due to decreased lung volumes supine patient positioning. There is mild elevation of the right hemidiaphragm. Minimal right basilar linear heterogeneous opacities. Pulmonary venous congestion without  frank evidence of edema. No supine evidence of pneumothorax or pleural effusion. No evidence of edema. No acute osseus abnormalities. A spinal  stimulator device overlies the lower thoracic spine.  IMPRESSION: Decreased lung volumes with right basilar heterogeneous opacities, possibly atelectasis though infection and/or aspiration could have a similar appearance. Further evaluation with a PA and lateral chest radiograph may be obtained as clinically indicated.   Electronically Signed   By: Simonne Come M.D.   On: 06/30/2014 14:44    Microbiology: Recent Results (from the past 240 hour(s))  Urine culture     Status: None   Collection Time: 06/30/14  6:47 PM  Result Value Ref Range Status   Specimen Description URINE, CATHETERIZED  Final   Special Requests NONE  Final   Colony Count NO GROWTH Performed at [facility]   Final   Culture NO GROWTH Performed at [facility]   Final   Report Status 07/02/2014 FINAL  Final  MRSA PCR Screening     Status: None   Collection Time: 06/30/14  6:58 PM  Result Value Ref Range Status   MRSA by PCR NEGATIVE NEGATIVE Final    Comment:        The GeneXpert MRSA Assay (FDA approved for NASAL specimens only), is one component of a comprehensive MRSA colonization surveillance program. It is not intended to diagnose MRSA infection nor to guide or monitor treatment for MRSA infections. Performed at [facility]      Labs: Basic Metabolic Panel:  Recent Labs Lab 06/30/14 1433 06/30/14 1439 07/01/14 0652  NA 141 142 141  K 4.4 4.3 4.4  CL 104 101 104  CO2 28  --  29  GLUCOSE 101* 98 108*  BUN 15 16 12   CREATININE 1.00 1.10 1.00  CALCIUM 9.4  --  9.3   Liver Function Tests:  Recent Labs Lab 06/30/14 1433  AST 32  ALT 30  ALKPHOS 68  BILITOT 0.3  PROT 7.0  ALBUMIN 4.0   No results for input(s): LIPASE, AMYLASE in the last 168 hours. No results for input(s): AMMONIA in the last 168 hours. CBC:  Recent Labs Lab 06/30/14 1433 06/30/14 1439  WBC 8.4  --   NEUTROABS 3.7  --   HGB 14.7 15.6  HCT 44.3 46.0  MCV 98.0  --   PLT 199  --     Cardiac Enzymes: No results for input(s): CKTOTAL, CKMB, CKMBINDEX, TROPONINI in the last 168 hours. BNP: BNP (last 3 results) No results for input(s): BNP in the last 8760 hours.  ProBNP (last 3 results) No results for input(s): PROBNP in the last 8760 hours.  CBG:  Recent Labs Lab 06/30/14 1409  GLUCAP 93    Time coordinating discharge: 45 minutes  Signed:  Laniyah Boyd  [facility] 07/03/2014, 9:25 AM

## 2014-07-03 NOTE — Clinical Social Work Note (Signed)
CSW stopped by to see patient prior to his dc today- CSW had reviewed chart and noted MD dc summary indicating his history of heroine abuse and plans for follow up at a methadone clinic.  Patient was sitting on edge of bed upon CSW  Visit and was open to discussing the updated information about his history of heroine abuse. "It's embarrassing and not something I like to share". CSW acknowledged his feelings and praised him for his accomplishment of being "clean" for over 10 years. He shared openly with CSW about his "surprise" with his low tolerance for pain after his work accident. CSW talked with him about addiction and the difficulty in managing pain with this type of history- he appears to comprehend this and is open to getting help for his pain through the methadone clinic. Patient very open and engaged- appreciative of support and assistance and plans to f/u tomorrow at the [facility].    Reece LevyJanet Sly Parlee, MSW, Theresia MajorsLCSWA [phone]

## 2014-10-10 ENCOUNTER — Other Ambulatory Visit (HOSPITAL_COMMUNITY): Payer: Self-pay | Admitting: *Deleted

## 2014-10-10 NOTE — Pre-Procedure Instructions (Addendum)
    Ryan Boyd  10/10/2014      Your procedure is scheduled on Wednesday, October 17, 2014 at 8:30 AM.   Report to [facility] Entrance "A"Admitting Office at 6:30 AM.   Call this number if you have problems the morning of surgery: [phone]              Any questions prior to day of surgery, please call [phone] between 8 & 4 PM.   Remember:  Do not eat food or drink liquids after midnight Tuesday, 10/16/14.  Take these medicines the morning of surgery with A SIP OF WATER: Tylenol - if needed, Methocarbamol (Robaxin) - if needed   Do not wear jewelry.  Do not wear lotions, powders, or cologne.  You may wear deodorant.  Men may shave face and neck.  Do not bring valuables to the hospital.  [facility] is not responsible for any belongings or valuables.  Contacts, dentures or bridgework may not be worn into surgery.  Leave your suitcase in the car.  After surgery it may be brought to your room.  For patients admitted to the hospital, discharge time will be determined by your treatment team.  Special instructions:  See "Preparing for Surgery" Instruction sheet.   Please read over the following fact sheets that you were given. Pain Booklet, Coughing and Deep Breathing, Blood Transfusion Information, MRSA Information and Surgical Site Infection Prevention

## 2014-10-11 ENCOUNTER — Encounter (HOSPITAL_COMMUNITY): Payer: Self-pay

## 2014-10-11 ENCOUNTER — Encounter (HOSPITAL_COMMUNITY)
Admission: RE | Admit: 2014-10-11 | Discharge: 2014-10-11 | Disposition: A | Discharge status: Home / Self Care | Payer: Worker's Compensation | Source: Ambulatory Visit | Attending: Orthopedic Surgery | Admitting: Orthopedic Surgery

## 2014-10-11 DIAGNOSIS — Z01812 Encounter for preprocedural laboratory examination: Secondary | ICD-10-CM | POA: Insufficient documentation

## 2014-10-11 DIAGNOSIS — Z0183 Encounter for blood typing: Secondary | ICD-10-CM | POA: Diagnosis not present

## 2014-10-11 LAB — CBC
HEMATOCRIT: 46.2 % (ref 39.0–52.0)
HEMOGLOBIN: 16 g/dL (ref 13.0–17.0)
MCH: 32.1 pg (ref 26.0–34.0)
MCHC: 34.6 g/dL (ref 30.0–36.0)
MCV: 92.6 fL (ref 78.0–100.0)
PLATELETS: 252 10*3/uL (ref 150–400)
RBC: 4.99 MIL/uL (ref 4.22–5.81)
RDW: 12.7 % (ref 11.5–15.5)
WBC: 7.8 10*3/uL (ref 4.0–10.5)

## 2014-10-11 LAB — BASIC METABOLIC PANEL
Anion gap: 9 (ref 5–15)
BUN: 18 mg/dL (ref 6–20)
CALCIUM: 10 mg/dL (ref 8.9–10.3)
CO2: 28 mmol/L (ref 22–32)
CREATININE: 0.94 mg/dL (ref 0.61–1.24)
Chloride: 104 mmol/L (ref 101–111)
GFR calc non Af Amer: >60 mL/min (ref >60)
Glucose, Bld: 107 mg/dL — ABNORMAL HIGH (ref 65–99)
Potassium: 4.6 mmol/L (ref 3.5–5.1)
Sodium: 141 mmol/L (ref 135–145)

## 2014-10-11 LAB — TYPE AND SCREEN
ABO/RH(D): O POS
ANTIBODY SCREEN: NEGATIVE

## 2014-10-11 LAB — SURGICAL PCR SCREEN
MRSA, PCR: NEGATIVE
Staphylococcus aureus: NEGATIVE

## 2014-10-11 NOTE — Progress Notes (Signed)
Pt. Reports no PCP.  No orders in epic. Called office for orders.

## 2014-10-17 ENCOUNTER — Encounter (HOSPITAL_COMMUNITY): Payer: Self-pay | Admitting: Anesthesiology

## 2014-10-17 ENCOUNTER — Encounter (HOSPITAL_COMMUNITY)
Admission: RE | Disposition: A | Discharge status: Home / Self Care | Payer: Self-pay | Source: Ambulatory Visit | Attending: Orthopedic Surgery

## 2014-10-17 ENCOUNTER — Inpatient Hospital Stay (HOSPITAL_COMMUNITY): Payer: Worker's Compensation | Admitting: Anesthesiology

## 2014-10-17 ENCOUNTER — Inpatient Hospital Stay (HOSPITAL_COMMUNITY): Payer: Worker's Compensation

## 2014-10-17 ENCOUNTER — Inpatient Hospital Stay (HOSPITAL_COMMUNITY)
Admission: RE | Admit: 2014-10-17 | Discharge: 2014-10-19 | DRG: 460 | Disposition: A | Discharge status: Home / Self Care | Payer: Worker's Compensation | Source: Ambulatory Visit | Attending: Orthopedic Surgery | Admitting: Orthopedic Surgery

## 2014-10-17 DIAGNOSIS — G894 Chronic pain syndrome: Secondary | ICD-10-CM | POA: Diagnosis present

## 2014-10-17 DIAGNOSIS — M5417 Radiculopathy, lumbosacral region: Secondary | ICD-10-CM | POA: Diagnosis present

## 2014-10-17 DIAGNOSIS — Z881 Allergy status to other antibiotic agents status: Secondary | ICD-10-CM

## 2014-10-17 DIAGNOSIS — M960 Pseudarthrosis after fusion or arthrodesis: Secondary | ICD-10-CM | POA: Diagnosis present

## 2014-10-17 DIAGNOSIS — G8929 Other chronic pain: Secondary | ICD-10-CM | POA: Diagnosis present

## 2014-10-17 DIAGNOSIS — K567 Ileus, unspecified: Secondary | ICD-10-CM

## 2014-10-17 DIAGNOSIS — Z888 Allergy status to other drugs, medicaments and biological substances status: Secondary | ICD-10-CM | POA: Diagnosis not present

## 2014-10-17 DIAGNOSIS — Z419 Encounter for procedure for purposes other than remedying health state, unspecified: Secondary | ICD-10-CM

## 2014-10-17 DIAGNOSIS — K9189 Other postprocedural complications and disorders of digestive system: Secondary | ICD-10-CM

## 2014-10-17 DIAGNOSIS — Z981 Arthrodesis status: Secondary | ICD-10-CM

## 2014-10-17 HISTORY — PX: SPINAL CORD STIMULATOR INSERTION: SHX5378

## 2014-10-17 SURGERY — POSTERIOR LUMBAR FUSION 1 LEVEL
Anesthesia: General | Site: Back

## 2014-10-17 MED ORDER — HYDROMORPHONE HCL 1 MG/ML IJ SOLN
0.2500 mg | INTRAMUSCULAR | Status: DC | PRN
  Administered 2014-10-17 (×6): 0.5 mg via INTRAVENOUS

## 2014-10-17 MED ORDER — METHOCARBAMOL 1000 MG/10ML IJ SOLN
500.0000 mg | INTRAMUSCULAR | Status: AC
  Administered 2014-10-17: 500 mg via INTRAVENOUS
  Filled 2014-10-17: qty 5

## 2014-10-17 MED ORDER — SODIUM CHLORIDE 0.9 % IV SOLN
250.0000 mL | INTRAVENOUS | Status: DC
Start: 2014-10-17 — End: 2014-10-19

## 2014-10-17 MED ORDER — FENTANYL CITRATE (PF) 250 MCG/5ML IJ SOLN
INTRAMUSCULAR | Status: AC
  Filled 2014-10-17: qty 5

## 2014-10-17 MED ORDER — ONDANSETRON HCL 4 MG/2ML IJ SOLN
4.0000 mg | INTRAMUSCULAR | Status: DC | PRN
  Administered 2014-10-17 – 2014-10-19 (×3): 4 mg via INTRAVENOUS
  Filled 2014-10-17 (×2): qty 2

## 2014-10-17 MED ORDER — METHOCARBAMOL 1000 MG/10ML IJ SOLN
500.0000 mg | Freq: Four times a day (QID) | INTRAVENOUS | Status: DC | PRN
  Filled 2014-10-17: qty 5

## 2014-10-17 MED ORDER — 0.9 % SODIUM CHLORIDE (POUR BTL) OPTIME
TOPICAL | Status: DC | PRN
  Administered 2014-10-17: 2000 mL

## 2014-10-17 MED ORDER — HYDROMORPHONE HCL 1 MG/ML IJ SOLN
INTRAMUSCULAR | Status: AC
  Filled 2014-10-17: qty 1

## 2014-10-17 MED ORDER — EPHEDRINE SULFATE 50 MG/ML IJ SOLN
INTRAMUSCULAR | Status: AC
  Filled 2014-10-17: qty 1

## 2014-10-17 MED ORDER — THROMBIN 20000 UNITS EX SOLR
OROMUCOSAL | Status: DC | PRN
  Administered 2014-10-17: 20 mL via TOPICAL

## 2014-10-17 MED ORDER — LACTATED RINGERS IV SOLN
INTRAVENOUS | Status: DC
Start: 2014-10-17 — End: 2014-10-19

## 2014-10-17 MED ORDER — LIDOCAINE HCL (CARDIAC) 20 MG/ML IV SOLN
INTRAVENOUS | Status: DC | PRN
  Administered 2014-10-17: 60 mg via INTRAVENOUS

## 2014-10-17 MED ORDER — SUCCINYLCHOLINE CHLORIDE 20 MG/ML IJ SOLN
INTRAMUSCULAR | Status: AC
  Filled 2014-10-17: qty 1

## 2014-10-17 MED ORDER — MAGNESIUM CITRATE PO SOLN
0.5000 | Freq: Once | ORAL | Status: AC
  Administered 2014-10-17: 0.5 via ORAL
  Filled 2014-10-17: qty 296

## 2014-10-17 MED ORDER — CEFAZOLIN SODIUM-DEXTROSE 2-3 GM-% IV SOLR
INTRAVENOUS | Status: AC
  Filled 2014-10-17: qty 50

## 2014-10-17 MED ORDER — PHENOL 1.4 % MT LIQD: 1.0000 | OROMUCOSAL | Status: DC | PRN

## 2014-10-17 MED ORDER — KETAMINE HCL 100 MG/ML IJ SOLN
INTRAMUSCULAR | Status: DC | PRN
  Administered 2014-10-17 (×2): 50 mg via INTRAVENOUS

## 2014-10-17 MED ORDER — MORPHINE SULFATE 2 MG/ML IJ SOLN
1.0000 mg | INTRAMUSCULAR | Status: DC | PRN
  Administered 2014-10-17: 2 mg via INTRAVENOUS
  Administered 2014-10-17 (×2): 4 mg via INTRAVENOUS
  Filled 2014-10-17 (×3): qty 2

## 2014-10-17 MED ORDER — CEFAZOLIN SODIUM 1-5 GM-% IV SOLN
1.0000 g | Freq: Three times a day (TID) | INTRAVENOUS | Status: AC
  Administered 2014-10-17 – 2014-10-18 (×2): 1 g via INTRAVENOUS
  Filled 2014-10-17 (×2): qty 50

## 2014-10-17 MED ORDER — SODIUM CHLORIDE 0.9 % IJ SOLN: 3.0000 mL | INTRAMUSCULAR | Status: DC | PRN

## 2014-10-17 MED ORDER — ONDANSETRON HCL 4 MG/2ML IJ SOLN
INTRAMUSCULAR | Status: AC
  Filled 2014-10-17: qty 2

## 2014-10-17 MED ORDER — ACETAMINOPHEN 10 MG/ML IV SOLN
INTRAVENOUS | Status: DC | PRN
  Administered 2014-10-17: 1000 mg via INTRAVENOUS

## 2014-10-17 MED ORDER — LIDOCAINE HCL (CARDIAC) 20 MG/ML IV SOLN
INTRAVENOUS | Status: AC
  Filled 2014-10-17: qty 5

## 2014-10-17 MED ORDER — MIDAZOLAM HCL 5 MG/5ML IJ SOLN
INTRAMUSCULAR | Status: DC | PRN
  Administered 2014-10-17: 2 mg via INTRAVENOUS

## 2014-10-17 MED ORDER — MIDAZOLAM HCL 2 MG/2ML IJ SOLN
INTRAMUSCULAR | Status: AC
  Filled 2014-10-17: qty 2

## 2014-10-17 MED ORDER — THROMBIN 20000 UNITS EX SOLR
CUTANEOUS | Status: AC
  Filled 2014-10-17: qty 20000

## 2014-10-17 MED ORDER — BUPIVACAINE-EPINEPHRINE (PF) 0.25% -1:200000 IJ SOLN
INTRAMUSCULAR | Status: AC
  Filled 2014-10-17: qty 30

## 2014-10-17 MED ORDER — METHOCARBAMOL 500 MG PO TABS
500.0000 mg | ORAL_TABLET | Freq: Four times a day (QID) | ORAL | Status: DC | PRN
  Administered 2014-10-17 (×2): 500 mg via ORAL
  Filled 2014-10-17 (×2): qty 1

## 2014-10-17 MED ORDER — OXYCODONE HCL 5 MG PO TABS
ORAL_TABLET | ORAL | Status: AC
  Filled 2014-10-17: qty 2

## 2014-10-17 MED ORDER — CLONIDINE HCL 0.1 MG PO TABS: 0.1000 mg | ORAL_TABLET | Freq: Two times a day (BID) | ORAL | Status: DC

## 2014-10-17 MED ORDER — FLEET ENEMA 7-19 GM/118ML RE ENEM
1.0000 | ENEMA | Freq: Once | RECTAL | Status: AC
  Administered 2014-10-18: 1 via RECTAL
  Filled 2014-10-17: qty 1

## 2014-10-17 MED ORDER — SODIUM CHLORIDE 0.9 % IJ SOLN
3.0000 mL | Freq: Two times a day (BID) | INTRAMUSCULAR | Status: DC
  Administered 2014-10-18: 3 mL via INTRAVENOUS

## 2014-10-17 MED ORDER — THROMBIN 20000 UNITS EX SOLR: CUTANEOUS | Status: DC | PRN

## 2014-10-17 MED ORDER — KETAMINE HCL 100 MG/ML IJ SOLN
INTRAMUSCULAR | Status: AC
  Filled 2014-10-17: qty 1

## 2014-10-17 MED ORDER — ARTIFICIAL TEARS OP OINT
TOPICAL_OINTMENT | OPHTHALMIC | Status: DC | PRN
  Administered 2014-10-17: 1 via OPHTHALMIC

## 2014-10-17 MED ORDER — SUCCINYLCHOLINE CHLORIDE 20 MG/ML IJ SOLN
INTRAMUSCULAR | Status: DC | PRN
  Administered 2014-10-17: 140 mg via INTRAVENOUS

## 2014-10-17 MED ORDER — LACTATED RINGERS IV SOLN
INTRAVENOUS | Status: DC | PRN
  Administered 2014-10-17 (×3): via INTRAVENOUS

## 2014-10-17 MED ORDER — MENTHOL 3 MG MT LOZG: 1.0000 | LOZENGE | OROMUCOSAL | Status: DC | PRN

## 2014-10-17 MED ORDER — CEFAZOLIN SODIUM-DEXTROSE 2-3 GM-% IV SOLR
2.0000 g | INTRAVENOUS | Status: AC
  Administered 2014-10-17 (×2): 2 g via INTRAVENOUS
  Filled 2014-10-17: qty 50

## 2014-10-17 MED ORDER — PROPOFOL 10 MG/ML IV BOLUS
INTRAVENOUS | Status: AC
  Filled 2014-10-17: qty 20

## 2014-10-17 MED ORDER — PROPOFOL 10 MG/ML IV BOLUS
INTRAVENOUS | Status: DC | PRN
  Administered 2014-10-17: 200 mg via INTRAVENOUS

## 2014-10-17 MED ORDER — ONDANSETRON HCL 4 MG/2ML IJ SOLN
INTRAMUSCULAR | Status: DC | PRN
  Administered 2014-10-17: 4 mg via INTRAVENOUS

## 2014-10-17 MED ORDER — ACETAMINOPHEN 10 MG/ML IV SOLN
1000.0000 mg | Freq: Four times a day (QID) | INTRAVENOUS | Status: AC
  Administered 2014-10-17 – 2014-10-18 (×4): 1000 mg via INTRAVENOUS
  Filled 2014-10-17 (×4): qty 100

## 2014-10-17 MED ORDER — ROCURONIUM BROMIDE 50 MG/5ML IV SOLN
INTRAVENOUS | Status: AC
  Filled 2014-10-17: qty 1

## 2014-10-17 MED ORDER — FENTANYL CITRATE (PF) 100 MCG/2ML IJ SOLN
INTRAMUSCULAR | Status: DC | PRN
  Administered 2014-10-17 (×3): 100 ug via INTRAVENOUS
  Administered 2014-10-17: 50 ug via INTRAVENOUS
  Administered 2014-10-17: 100 ug via INTRAVENOUS
  Administered 2014-10-17: 50 ug via INTRAVENOUS
  Administered 2014-10-17 (×5): 100 ug via INTRAVENOUS

## 2014-10-17 MED ORDER — HEMOSTATIC AGENTS (NO CHARGE) OPTIME
TOPICAL | Status: DC | PRN
  Administered 2014-10-17: 1 via TOPICAL

## 2014-10-17 MED ORDER — PHENYLEPHRINE 40 MCG/ML (10ML) SYRINGE FOR IV PUSH (FOR BLOOD PRESSURE SUPPORT)
PREFILLED_SYRINGE | INTRAVENOUS | Status: AC
  Filled 2014-10-17: qty 10

## 2014-10-17 MED ORDER — OXYCODONE HCL 5 MG PO TABS
10.0000 mg | ORAL_TABLET | ORAL | Status: DC | PRN
  Administered 2014-10-17 – 2014-10-18 (×4): 10 mg via ORAL
  Filled 2014-10-17 (×3): qty 2

## 2014-10-17 MED ORDER — ACETAMINOPHEN 10 MG/ML IV SOLN
INTRAVENOUS | Status: AC
  Filled 2014-10-17: qty 100

## 2014-10-17 MED ORDER — SODIUM CHLORIDE 0.9 % IJ SOLN
INTRAMUSCULAR | Status: AC
  Filled 2014-10-17: qty 10

## 2014-10-17 MED ORDER — BUPIVACAINE-EPINEPHRINE 0.25% -1:200000 IJ SOLN
INTRAMUSCULAR | Status: DC | PRN
  Administered 2014-10-17: 20 mL

## 2014-10-17 SURGICAL SUPPLY — 91 items
BLADE SURG ROTATE 9660 (MISCELLANEOUS) IMPLANT
BUR EGG ELITE 4.0 (BURR) ×1 IMPLANT
BUR EGG ELITE 4.0MM (BURR) ×1
BUR EGG ELITE 5.0 (BURR) IMPLANT
BUR EGG ELITE 5.0MM (BURR)
BUR ROUND PRECISION 4.0 (BURR) IMPLANT
BUR ROUND PRECISION 4.0MM (BURR)
CANISTER SUCTION 2500CC (MISCELLANEOUS) ×3 IMPLANT
CLIP NEUROVISION LG (CLIP) ×2 IMPLANT
CLOSURE STERI-STRIP 1/2X4 (GAUZE/BANDAGES/DRESSINGS) ×1
CLSR STERI-STRIP ANTIMIC 1/2X4 (GAUZE/BANDAGES/DRESSINGS) ×2 IMPLANT
COVER PROBE W GEL 5X96 (DRAPES) IMPLANT
COVER SURGICAL LIGHT HANDLE (MISCELLANEOUS) ×3 IMPLANT
DRAPE C-ARM 42X72 X-RAY (DRAPES) ×3 IMPLANT
DRAPE C-ARMOR (DRAPES) ×3 IMPLANT
DRAPE INCISE IOBAN 85X60 (DRAPES) ×5 IMPLANT
DRAPE POUCH INSTRU U-SHP 10X18 (DRAPES) ×3 IMPLANT
DRAPE SURG 17X23 STRL (DRAPES) ×3 IMPLANT
DRAPE U-SHAPE 47X51 STRL (DRAPES) ×5 IMPLANT
DRSG MEPILEX BORDER 4X4 (GAUZE/BANDAGES/DRESSINGS) ×1 IMPLANT
DRSG MEPILEX BORDER 4X8 (GAUZE/BANDAGES/DRESSINGS) ×9 IMPLANT
DURAPREP 26ML APPLICATOR (WOUND CARE) ×3 IMPLANT
DYNAMIC STIMULATION SHEATH ×1 IMPLANT
ELECT BLADE 4.0 EZ CLEAN MEGAD (MISCELLANEOUS) ×3
ELECT BLADE 6.5 EXT (BLADE) IMPLANT
ELECT CAUTERY BLADE 6.4 (BLADE) ×1 IMPLANT
ELECT PENCIL ROCKER SW 15FT (MISCELLANEOUS) ×3 IMPLANT
ELECT REM PT RETURN 9FT ADLT (ELECTROSURGICAL) ×3
ELECTRODE BLDE 4.0 EZ CLN MEGD (MISCELLANEOUS) ×1 IMPLANT
ELECTRODE REM PT RTRN 9FT ADLT (ELECTROSURGICAL) ×1 IMPLANT
GLOVE BIOGEL PI IND STRL 8.5 (GLOVE) ×1 IMPLANT
GLOVE BIOGEL PI INDICATOR 8.5 (GLOVE) ×2
GLOVE SS BIOGEL STRL SZ 8.5 (GLOVE) ×1 IMPLANT
GLOVE SS N UNI LF 8.5 STRL (GLOVE) ×6 IMPLANT
GLOVE SUPERSENSE BIOGEL SZ 8.5 (GLOVE) ×2
GOWN STRL REUS W/ TWL LRG LVL3 (GOWN DISPOSABLE) ×1 IMPLANT
GOWN STRL REUS W/TWL 2XL LVL3 (GOWN DISPOSABLE) ×6 IMPLANT
GOWN STRL REUS W/TWL LRG LVL3 (GOWN DISPOSABLE) ×3
GUIDEWIRE NITINOL BEVEL TIP (WIRE) ×2 IMPLANT
IPG PROTEGE MRI (Neuro Prosthesis/Implant) ×2 IMPLANT
KIT BASIN OR (CUSTOM PROCEDURE TRAY) ×3 IMPLANT
KIT NDL NVM5 EMG ELECT (KITS) IMPLANT
KIT NEEDLE NVM5 EMG ELECT (KITS) ×1 IMPLANT
KIT NEEDLE NVM5 EMG ELECTRODE (KITS) ×2
KIT POSITION SURG JACKSON T1 (MISCELLANEOUS) IMPLANT
KIT ROOM TURNOVER OR (KITS) ×3 IMPLANT
LAMI NARROW PRIPOLE 16CH (Orthopedic Implant) ×2 IMPLANT
MARKER SKIN DUAL TIP RULER LAB (MISCELLANEOUS) ×2 IMPLANT
MIX DBX 10CC 35% BONE (Bone Implant) ×4 IMPLANT
NDL I-PASS III (NEEDLE) IMPLANT
NDL SPNL 18GX3.5 QUINCKE PK (NEEDLE) ×3 IMPLANT
NDL SUT 6 .5 CRC .975X.05 MAYO (NEEDLE) ×1 IMPLANT
NEEDLE 22X1 1/2 (OR ONLY) (NEEDLE) ×3 IMPLANT
NEEDLE I-PASS III (NEEDLE) ×3 IMPLANT
NEEDLE MAYO TAPER (NEEDLE) ×3
NEEDLE SPNL 18GX3.5 QUINCKE PK (NEEDLE) ×9 IMPLANT
NS IRRIG 1000ML POUR BTL (IV SOLUTION) ×3 IMPLANT
PACK LAMINECTOMY ORTHO (CUSTOM PROCEDURE TRAY) ×3 IMPLANT
PACK UNIVERSAL I (CUSTOM PROCEDURE TRAY) ×3 IMPLANT
PAD ARMBOARD 7.5X6 YLW CONV (MISCELLANEOUS) ×6 IMPLANT
PATIENT PROGRAMMER PROTEGE MRI (MISCELLANEOUS) ×2 IMPLANT
PATTIES SURGICAL .5 X.5 (GAUZE/BANDAGES/DRESSINGS) IMPLANT
PATTIES SURGICAL .5 X1 (DISPOSABLE) ×3 IMPLANT
POSITIONER HEAD PRONE TRACH (MISCELLANEOUS) ×3 IMPLANT
PROBE BALL TIP NVM5 SNG USE (BALLOONS) ×2 IMPLANT
ROD RELINE MAS LORD 5.5X45MM (Rod) ×4 IMPLANT
SCREW LOCK RELINE 5.5 TULIP (Screw) ×8 IMPLANT
SCREW MAS RELINE POLY 6.5X40 (Screw) ×4 IMPLANT
SCREW RELINE MAS 7.5X35MM POLY (Screw) ×4 IMPLANT
SHEATH NEURO STEM VISION DYN (Stem) ×2 IMPLANT
SPONGE LAP 4X18 X RAY DECT (DISPOSABLE) ×8 IMPLANT
SPONGE SURGIFOAM ABS GEL 100 (HEMOSTASIS) ×3 IMPLANT
STAPLER VISISTAT 35W (STAPLE) ×1 IMPLANT
SURGIFLO TRUKIT (HEMOSTASIS) ×3 IMPLANT
SUT BONE WAX W31G (SUTURE) ×3 IMPLANT
SUT FIBERWIRE #2 38 REV NDL BL (SUTURE) ×6
SUT MNCRL AB 3-0 PS2 18 (SUTURE) ×10 IMPLANT
SUT VIC AB 1 CT1 18XCR BRD 8 (SUTURE) ×1 IMPLANT
SUT VIC AB 1 CT1 27 (SUTURE)
SUT VIC AB 1 CT1 27XBRD ANBCTR (SUTURE) ×1 IMPLANT
SUT VIC AB 1 CT1 8-18 (SUTURE) ×9
SUT VIC AB 2-0 CT1 18 (SUTURE) ×7 IMPLANT
SUTURE FIBERWR#2 38 REV NDL BL (SUTURE) ×1 IMPLANT
SYR BULB IRRIGATION 50ML (SYRINGE) ×3 IMPLANT
SYR CONTROL 10ML LL (SYRINGE) ×3 IMPLANT
SYSTEM CHARGING PRODIGY (MISCELLANEOUS) ×2 IMPLANT
TOWEL OR 17X24 6PK STRL BLUE (TOWEL DISPOSABLE) ×3 IMPLANT
TOWEL OR 17X26 10 PK STRL BLUE (TOWEL DISPOSABLE) ×3 IMPLANT
TRAY FOLEY CATH 16FRSI W/METER (SET/KITS/TRAYS/PACK) ×3 IMPLANT
WATER STERILE IRR 1000ML POUR (IV SOLUTION) ×1 IMPLANT
YANKAUER SUCT BULB TIP NO VENT (SUCTIONS) ×3 IMPLANT

## 2014-10-17 NOTE — H&P (Signed)
History of Present Illness  The patient is a 34 year old male who presents for a Follow-up for H & P. The patient is scheduled for a PSFI L5-S1 and SCS implant and revision to be performed by Dr. Debria Garret D. Shon Baton, MD on 10/08/14 . Please see the hospital record for complete dictated history and physical.  The patient is being followed for their nerve pain reduced with the SCS trial . They are now 3 year(s) out from injury. Symptoms reported today include: pain, aching, throbbing, stiffness, pain with weightbearing, difficulty ambulating, leg pain (right leg), pain with lying, pain with lifting, pain with sitting and pain with standing. The patient states that they are doing poorly. Current treatment includes: bracing, activity modification and pain medications. The following medication has been used for pain control: antiinflammatory medication (ibuprofen), Tylenol and robaxin. The patient reports their current pain level to be 9 / 10. The patient presents today following St Jude SCS trial procedure 06/29/2014 per pt. he got nerve pain relief.  Allergies  PredniSONE *CORTICOSTEROIDS* Erythromycin *MACROLIDES* Corticosteroids  Family History  No pertinent family history First Degree Relatives.  Social History  Current work status disabled Tobacco use Never smoker. Drug/Alcohol Rehab (Currently) no Drug/Alcohol Rehab (Previously) no Illicit drug use no Alcohol use never consumed alcohol Marital status married Children 3 Living situation live with spouse  Medication History  Robaxin (  Tablet, 1 (one) Oral three times daily, as needed, Taken starting 10/01/2014) Active. Lidoderm (5% Patch, 1 (one) External to affected area for 12 hrs, Taken starting 09/18/2014) Active. TraZODone HCl (  Tablet, 1 (one) Tablet Oral at bedtime, Taken starting 09/27/2014) Active. Tylenol (  Tablet, Oral) Active. Garcinia Cambogia-Chromium (500-200MG -MCG Tablet, Oral) Active. Multiple  Vitamin (1 (one) Oral) Active. Medications Reconciled  Vitals  10/12/2014 8:13 AM Pain Level: 9/10 BP: 132/82 (Sitting, Left Arm, Standard) temp 98.3  Physical Exam  General General Appearance-Not in acute distress. Orientation-Oriented X3. Build & Nutrition-Well nourished and Well developed.  Integumentary General Characteristics Surgical Scars - no surgical scar evidence of previous lumbar surgery. Lumbar Spine-Skin examination of the lumbar spine is without deformity, skin lesions, lacerations or abrasions.  Chest and Lung Exam Auscultation Breath sounds - Normal and Clear.  Cardiovascular Auscultation Rhythm - Regular rate and rhythm.  Abdomen Palpation/Percussion Palpation and Percussion of the abdomen reveal - Soft, Non Tender and No Rebound tenderness.  Peripheral Vascular Lower Extremity Palpation - Posterior tibial pulse - Bilateral - 2+. Dorsalis pedis pulse - Bilateral - 2+.  Neurologic Sensation Lower Extremity - Bilateral - sensation is intact in the lower extremity. Reflexes Patellar Reflex - Bilateral - 2+. Achilles Reflex - Bilateral - 2+. Clonus - Bilateral - clonus not present. Hoffman's Sign - Bilateral - Hoffman's sign not present. Testing Seated Straight Leg Raise - Right - Seated straight leg raise positive.  Musculoskeletal Spine/Ribs/Pelvis  Lumbosacral Spine: Inspection and Palpation - Tenderness - left lumbar paraspinals tender to palpation and right lumbar paraspinals tender to palpation. Strength and Tone: Strength - Hip Flexion - Bilateral - 3-/5. Note: pt reports it elicits pain. Knee Extension - Bilateral - 5/5. Knee Flexion - Bilateral - 5/5. Ankle Dorsiflexion - Bilateral - 5/5. Ankle Plantarflexion - Bilateral - 5/5. Heel walk - Bilateral - unable to heel walk. Toe Walk - Bilateral - unable to walk on toes. Heel-Toe Walk - Bilateral - able to heel-toe walk with mild difficulty. Note: pain elicited. ROM - Flexion - moderately  decreased range of motion and painful. Extension - moderately decreased  range of motion and painful. Left Lateral Bending - moderately decreased range of motion and painful. Right Lateral Bending - moderately decreased range of motion and painful. Pain - neither flexion or extension is more painful than the other. Lumbosacral Spine - Waddell's Signs - no Waddell's signs present. Lower Extremity Range of Motion - No true hip, knee or ankle pain with range of motion. Gait and Station - [employer] - no assistive devices.  His CT scan from 07/09/2014 still demonstrates pseudarthrosis of the L5-S1. He has an unchanged central disk protrusion at L4-L5 with mild neural foraminal stenosis. He has got a right paracentral disk protrusion with mass effect on the descending right S1 nerve root. No central canal stenosis. Mild right, no significant left neural foraminal stenosis.  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 All of his questions and concerns were addressed. More than likely, because of the pseudarthrosis, we will also take an iliac crest bone graft as this will provide the best bone for fusion. All of his questions were addressed.  At this point to address that surgically, it would most likely require pedicle screw supplemental fixation and bone graft. In addition, since he did exceptionally well with the trial with respect to his radicular leg pain, I recommended converting that to a permanent implant. Both of these operations can be done simultaneously. The risks include infection, bleeding, nerve damage, death, stroke, paralysis, failure to heal, need for further surgery, ongoing or worse pain, loss of bowel and bladder control, nonunion, adjacent segment  collapse and ongoing pain, and migration of the lead. At this point in time, we will plan on addressing both the pseudarthrosis and the chronic pain syndrome.

## 2014-10-17 NOTE — Brief Op Note (Signed)
10/17/2014  1:31 PM  PATIENT:  Ryan Boyd  34 y.o. male  PRE-OPERATIVE DIAGNOSIS:  PSEUDOARTHROSIS L5-S1 CHRONIC PAIN  POST-OPERATIVE DIAGNOSIS:  PSEUDOARTHROSIS L5-S1 CHRONIC PAIN  PROCEDURE:  Procedure(s): POSTERIOR SPINAL FUSION INTERBODY  L5-S1 ILIAC CREST BONE GRAFT (N/A) SPINAL CORD STIMULATOR PLACEMENT    (N/A)  SURGEON:  Surgeon(s) and Role:    * Venita Lickahari Wm Fruchter, MD - Primary  PHYSICIAN ASSISTANT:   ASSISTANTS: Carmen Mayo   ANESTHESIA:   general  EBL:  Total I/O In: 2900 [I.V.:2900] Out: 875 [Urine:475; Blood:400]  BLOOD ADMINISTERED:none  DRAINS: none   LOCAL MEDICATIONS USED:  MARCAINE     SPECIMEN:  No Specimen  DISPOSITION OF SPECIMEN:  N/A  COUNTS:  YES  TOURNIQUET:  * No tourniquets in log *  DICTATION: .Other Dictation: Dictation Number [id number]  PLAN OF CARE: Admit to inpatient   PATIENT DISPOSITION:  PACU - hemodynamically stable.

## 2014-10-17 NOTE — OR Nursing (Signed)
Time Out performed prior to local and incision at 0904. Local at 0907 and time out at [facility]0908

## 2014-10-17 NOTE — Clinical Social Work Note (Signed)
Clinical Social Worker received standing order referral for possible ST-SNF placement.  Chart reviewed.  PT/OT pending.  Spoke with RN Case Manager who will follow up with patient to discuss home health needs if deemed appropriate.    CSW signing off - please re consult if social work needs arise.  Macario GoldsJesse Thula Stewart, [place] [phone]

## 2014-10-17 NOTE — Anesthesia Preprocedure Evaluation (Addendum)
Anesthesia Evaluation  Patient identified by MRN, date of birth, ID band Patient awake    Reviewed: Allergy & Precautions, H&P , NPO status , Patient's Chart, lab work & pertinent test results  Airway Mallampati: III  TM Distance: >3 FB Neck ROM: Full    Dental no notable dental hx. (+) Teeth Intact, Dental Advisory Given   Pulmonary neg pulmonary ROS,  breath sounds clear to auscultation  Pulmonary exam normal       Cardiovascular negative cardio ROS  Rhythm:Regular Rate:Normal     Neuro/Psych negative neurological ROS  negative psych ROS   GI/Hepatic negative GI ROS, Neg liver ROS,   Endo/Other  negative endocrine ROS  Renal/GU negative Renal ROS  negative genitourinary   Musculoskeletal   Abdominal   Peds  Hematology negative hematology ROS (+)   Anesthesia Other Findings   Reproductive/Obstetrics negative OB ROS                            Anesthesia Physical Anesthesia Plan  ASA: II  Anesthesia Plan: General   Post-op Pain Management:    Induction: Intravenous  Airway Management Planned: Oral ETT  Additional Equipment:   Intra-op Plan:   Post-operative Plan: Extubation in OR  Informed Consent: I have reviewed the patients History and Physical, chart, labs and discussed the procedure including the risks, benefits and alternatives for the proposed anesthesia with the patient or authorized representative who has indicated his/her understanding and acceptance.   Dental advisory given  Plan Discussed with: CRNA  Anesthesia Plan Comments:         Anesthesia Quick Evaluation

## 2014-10-17 NOTE — Anesthesia Postprocedure Evaluation (Signed)
  Anesthesia Post-op Note  Patient: Ryan Boyd  Procedure(s) Performed: Procedure(s): POSTERIOR SPINAL FUSION INTERBODY  L5-S1 ILIAC CREST BONE GRAFT (N/A) SPINAL CORD STIMULATOR PLACEMENT    (N/A)  Patient Location: PACU  Anesthesia Type:General  Level of Consciousness: awake and alert   Airway and Oxygen Therapy: Patient Spontanous Breathing  Post-op Pain: Controlled  Post-op Assessment: Post-op Vital signs reviewed, Patient's Cardiovascular Status Stable and Respiratory Function Stable  Post-op Vital Signs: Reviewed  Filed Vitals:   10/17/14 1430  BP:   Pulse: 100  Temp:   Resp: 13    Complications: No apparent anesthesia complications

## 2014-10-17 NOTE — Anesthesia Procedure Notes (Signed)
Procedure Name: Intubation Date/Time: 10/17/2014 8:39 AM Performed by: Ferol LuzMCMILLEN, Clayborn Milnes L Pre-anesthesia Checklist: Patient identified, Emergency Drugs available, Suction available, Patient being monitored and Timeout performed Patient Re-evaluated:Patient Re-evaluated prior to inductionOxygen Delivery Method: Circle system utilized Preoxygenation: Pre-oxygenation with 100% oxygen Intubation Type: IV induction Ventilation: Mask ventilation without difficulty Laryngoscope Size: Mac and 4 Grade View: Grade I Tube type: Oral Tube size: 8.0 mm Number of attempts: 1 Airway Equipment and Method: Stylet Placement Confirmation: ETT inserted through vocal cords under direct vision,  breath sounds checked- equal and bilateral and positive ETCO2 Secured at: 22 cm Tube secured with: Tape Dental Injury: Teeth and Oropharynx as per pre-operative assessment

## 2014-10-17 NOTE — Transfer of Care (Signed)
Immediate Anesthesia Transfer of Care Note  Patient: Ryan Boyd  Procedure(s) Performed: Procedure(s): POSTERIOR SPINAL FUSION INTERBODY  L5-S1 ILIAC CREST BONE GRAFT (N/A) SPINAL CORD STIMULATOR PLACEMENT    (N/A)  Patient Location: PACU  Anesthesia Type:General  Level of Consciousness: awake, alert , oriented and patient cooperative  Airway & Oxygen Therapy: Patient Spontanous Breathing and Patient connected to face mask oxygen  Post-op Assessment: Report given to RN, Post -op Vital signs reviewed and stable and Patient moving all extremities X 4  Post vital signs: Reviewed and stable  Last Vitals:  Filed Vitals:   10/17/14 0707  BP: 132/84  Pulse: 88  Temp: 36.3 C  Resp: 20    Complications: No apparent anesthesia complications

## 2014-10-18 ENCOUNTER — Encounter (HOSPITAL_COMMUNITY): Payer: Self-pay | Admitting: Orthopedic Surgery

## 2014-10-18 ENCOUNTER — Inpatient Hospital Stay (HOSPITAL_COMMUNITY): Payer: Worker's Compensation

## 2014-10-18 MED ORDER — HYDROMORPHONE HCL 2 MG PO TABS
2.0000 mg | ORAL_TABLET | ORAL | Status: DC | PRN
  Administered 2014-10-18 (×3): 4 mg via ORAL
  Filled 2014-10-18 (×3): qty 2

## 2014-10-18 MED ORDER — OXYCODONE HCL ER 15 MG PO T12A
15.0000 mg | EXTENDED_RELEASE_TABLET | Freq: Two times a day (BID) | ORAL | Status: DC
  Administered 2014-10-18 – 2014-10-19 (×3): 15 mg via ORAL
  Filled 2014-10-18 (×3): qty 1

## 2014-10-18 MED ORDER — PANTOPRAZOLE SODIUM 40 MG PO TBEC
40.0000 mg | DELAYED_RELEASE_TABLET | Freq: Every day | ORAL | Status: DC
  Administered 2014-10-18 – 2014-10-19 (×2): 40 mg via ORAL
  Filled 2014-10-18 (×2): qty 1

## 2014-10-18 MED ORDER — DIAZEPAM 5 MG PO TABS
5.0000 mg | ORAL_TABLET | Freq: Four times a day (QID) | ORAL | Status: DC | PRN
  Administered 2014-10-18 (×2): 5 mg via ORAL
  Filled 2014-10-18 (×2): qty 1

## 2014-10-18 MED ORDER — DIAZEPAM 5 MG PO TABS
10.0000 mg | ORAL_TABLET | Freq: Three times a day (TID) | ORAL | Status: DC | PRN
  Administered 2014-10-18 – 2014-10-19 (×2): 10 mg via ORAL
  Filled 2014-10-18 (×2): qty 2

## 2014-10-18 MED ORDER — OXYCODONE HCL 5 MG PO TABS
10.0000 mg | ORAL_TABLET | ORAL | Status: DC | PRN
  Administered 2014-10-18 – 2014-10-19 (×6): 10 mg via ORAL
  Filled 2014-10-18 (×6): qty 2

## 2014-10-18 NOTE — Progress Notes (Signed)
    Subjective: Procedure(s) (LRB): POSTERIOR SPINAL FUSION INTERBODY  L5-S1 ILIAC CREST BONE GRAFT (N/A) SPINAL CORD STIMULATOR PLACEMENT    (N/A) 1 Day Post-Op  Patient reports pain as 6 on 0-10 scale.  Reports decreased leg pain reports incisional back pain   Positive void Negative bowel movement Positive flatus Negative chest pain or shortness of breath  Objective: Vital signs in last 24 hours: Temp:  [97.2 F (36.2 C)-101.3 F (38.5 C)] 98.6 F (37 C) (06/30 0755) Pulse Rate:  [97-120] 120 (06/30 0755) Resp:  [13-24] 20 (06/30 0755) BP: (92-139)/(60-78) 92/60 mmHg (06/30 0755) SpO2:  [94 %-100 %] 95 % (06/30 0755)  Intake/Output from previous day: 06/29 0701 - 06/30 0700 In: 3740 [P.O.:840; I.V.:2900] Out: 2475 [Urine:2075; Blood:400]  Labs: No results for input(s): WBC, RBC, HCT, PLT in the last 72 hours. No results for input(s): NA, K, CL, CO2, BUN, CREATININE, GLUCOSE, CALCIUM in the last 72 hours. No results for input(s): LABPT, INR in the last 72 hours.  Physical Exam: Neurologically intact ABD soft Intact pulses distally Incision: dressing C/D/I Compartment soft negative st leg raise  Assessment/Plan: Patient stable  xrays satisfactory.  Positive dilated loops of  Continue mobilization with physical therapy Continue care  Advance diet Up with therapy  Will adjust pain medication for better control  Continue mobilization  Plan on d/c friday   Venita Lickahari Tasman Zapata, MD [facility] [phone]

## 2014-10-18 NOTE — Evaluation (Signed)
Physical Therapy Evaluation Patient Details Name: Ryan ManchesterBrandon M Boyd MRN: [id number] DOB: 1981/03/08 Today's Date: 10/18/2014   History of Present Illness  Pt is a 34 y/o male with a PMH of L5-S1 lumbar anterior interbody fusion in 06/2013. Pt has developed pseudoarthritis and presented on 6/29 for Posterior fusion L5-S1, iliac crest bone graft harvest on the R, and implantation of spinal cord stimulator from T10 laminotomy.  Clinical Impression  Pt admitted with above diagnosis. Pt currently with functional limitations due to the deficits listed below (see PT Problem List). At the time of PT eval pt was able to perform transfers and ambulation with increased time and min guard/supervision for safety. Pain was a limiting factor during session. Pt will benefit from skilled PT to increase their independence and safety with mobility to allow discharge to the venue listed below.       Follow Up Recommendations Outpatient PT;Supervision for mobility/OOB (When appropriate per post-op protocol)    Equipment Recommendations  None recommended by PT    Recommendations for Other Services       Precautions / Restrictions Precautions Precautions: Fall;Back Precaution Booklet Issued: Yes (comment) Precaution Comments: Reviewed back precautions with pt Required Braces or Orthoses: Spinal Brace Spinal Brace: Lumbar corset Restrictions Weight Bearing Restrictions: No      Mobility  Bed Mobility               General bed mobility comments: Pt was sitting up in the recliner upon PT arrival.   Transfers Overall transfer level: Needs assistance Equipment used: None Transfers: Sit to/from Stand Sit to Stand: Min guard         General transfer comment: Pt was able to power-up to full standing position without assistance. Increased time required to scoot out to edge of chair and achieve full standing.  Ambulation/Gait Ambulation/Gait assistance: Supervision Ambulation Distance (Feet): 125  Feet Assistive device: None Gait Pattern/deviations: Step-through pattern;Decreased stride length;Decreased weight shift to right;Decreased dorsiflexion - right;Decreased dorsiflexion - left;Trunk flexed Gait velocity: Decreased Gait velocity interpretation: Below normal speed for age/gender General Gait Details: Pt moving very slowly and appears stiff in all movement. Pt reports increased pain with mobility to 10/10.   Stairs            Wheelchair Mobility    Modified Rankin (Stroke Patients Only)       Balance Overall balance assessment: No apparent balance deficits (not formally assessed)                                           Pertinent Vitals/Pain Pain Assessment: 0-10 Pain Score: 10-Worst pain ever Pain Location: Low back and R hip Pain Descriptors / Indicators: Aching;Operative site guarding;Grimacing Pain Intervention(s): Limited activity within patient's tolerance;Monitored during session;Repositioned    Home Living Family/patient expects to be discharged to:: Private residence Living Arrangements: Spouse/significant other Available Help at Discharge: Family Type of Home: House Home Access: Level entry     Home Layout: One level Home Equipment: Environmental consultantWalker - 2 wheels      Prior Function Level of Independence: Independent               Hand Dominance   Dominant Hand: Right    Extremity/Trunk Assessment   Upper Extremity Assessment: Defer to OT evaluation           Lower Extremity Assessment: RLE deficits/detail RLE Deficits / Details: Decreased  strength and AROM consistent with neurogenic pain prior to surgery    Cervical / Trunk Assessment: Normal  Communication   Communication: No difficulties  Cognition Arousal/Alertness: Awake/alert Behavior During Therapy: Flat affect Overall Cognitive Status: Within Functional Limits for tasks assessed                      General Comments      Exercises         Assessment/Plan    PT Assessment Patient needs continued PT services  PT Diagnosis Difficulty walking;Acute pain   PT Problem List Decreased strength;Decreased range of motion;Decreased activity tolerance;Decreased balance;Decreased mobility;Decreased knowledge of use of DME;Decreased safety awareness;Decreased knowledge of precautions;Pain  PT Treatment Interventions DME instruction;Gait training;Stair training;Functional mobility training;Therapeutic activities;Therapeutic exercise;Neuromuscular re-education;Patient/family education   PT Goals (Current goals can be found in the Care Plan section) Acute Rehab PT Goals Patient Stated Goal: Decrease pain PT Goal Formulation: With patient/family Time For Goal Achievement: 10/25/14 Potential to Achieve Goals: Good    Frequency Min 5X/week   Barriers to discharge        Co-evaluation               End of Session Equipment Utilized During Treatment: Back brace Activity Tolerance: Patient limited by pain Patient left: with call bell/phone within reach;with family/visitor present;Other (comment) (Sitting on EOB) Nurse Communication: Mobility status         Time: 1610-9604 PT Time Calculation (min) (ACUTE ONLY): 17 min   Charges:   PT Evaluation $Initial PT Evaluation Tier I: 1 Procedure     PT G Codes:        Conni Slipper Oct 19, 2014, 9:01 AM   Conni Slipper, PT, DPT Acute Rehabilitation Services Pager: [phone]

## 2014-10-18 NOTE — Op Note (Signed)
NAMEMANDRELL, Ryan Boyd NO.:  [id number]  MEDICAL RECORD NO.:  [id number]  LOCATION:  3C04C                        FACILITY:  [facility]  PHYSICIAN:  Renessa Wellnitz D. Shon Baton, M.D. DATE OF BIRTH:  30-Jan-1981  DATE OF PROCEDURE:  10/17/2014 DATE OF DISCHARGE:                              OPERATIVE REPORT   PREOPERATIVE DIAGNOSIS: 1. Pseudoarthrosis L5-S1 from previous anterior lumbar interbody     fusion. 2. Chronic pain syndrome (failed back syndrome).  POSTOPERATIVE DIAGNOSIS: 1. Pseudoarthrosis L5-S1 from previous anterior lumbar interbody     fusion. 2. Chronic pain syndrome (failed back syndrome).  OPERATIVE PROCEDURES: 1. Posterior segmental instrumentation and fusion L5-S1 utilizing     NuVasive pedicle screw system. 2. Iliac crest bone graft harvest, right side. 3. Implantation of spinal cord stimulator from T10 laminotomy.  Used     the [employer] Jude Tripole lead with the battery placed over the left     gluteal region.  COMPLICATIONS:  None.  CONDITION:  Stable.  HISTORY:  This is a very pleasant 34 year old gentleman who had a previous diskectomy in the past as well as an ELD.  The patient unfortunately had a pseudoarthrosis.  The patient also had ongoing back and moderate neurogenic leg pain.  The patient's symptoms were improved with trial spinal cord stimulator, so we elected to proceed with addressing both the pseudoarthrosis and the chronic pain syndrome.  All appropriate risks, benefits, and alternatives were discussed with the patient and consent was obtained.  OPERATIVE NOTE:  The patient was brought to the operating room, placed supine on the operating table.  After successful induction of general anesthesia and endotracheal intubation, TEDs, SCDs, and a Foley were inserted.  Needles were then placed into the lower extremities to monitor EMG function for screw placement.  The patient was then turned prone onto the Wilson frame.  All bony prominences  were well padded, and the back was prepped and draped in a standard fashion.  Time-out was taken to confirm patient, procedure, and all other pertinent important data.  Once this was done, his previous diskectomy incision was then re- incised and extended cephalad and caudad.  Sharp dissection was carried down to the deep fascia.  The deep fascia sharply incised, and I stripped the paraspinal muscles to expose the L4 and S1 spinous processes as well as the L5-S1 facet complex.  I also exposed the L5 transverse process.  Once I had done this on the left side, I then went to the right.  Care was taken as this was the site of the diskectomy and he had scar tissue. I made sure stay wide and not violate the thecal sac.  Once I palpated the facet complex, I was able to expose the transverse process and the sacral ala as well as the 5-1 facet complex.  With the posterior decompression completed, I elected to continue to undermine along the fascia until I could see and palpate the iliac crest.  Once I could do this, I then used an osteotome to lift the cortical rim of the iliac crest up and then used gouges and curettes to harvest bone graft for the fusion.  Once I had adequate amount  of bone graft, I irrigated the wound copiously with normal saline, and then I placed a thrombin-soaked Gelfoam Patty into the iliac crest site and then sutured the cortical roof back down to the iliac crest.  I then turned my attention to placing the screws.  Because at this point, through 2 smaller Wiltse stab incisions, I placed a Jamshidi needle percutaneously down to the lateral aspect of the facet complex; and then using neuro stimulation and fluoroscopic guidance, I advanced the Jamshidi needle into the pedicle and then into the vertebral body.  I confirmed trajectory and position in both planes.  Once the pedicle was cannulated, I placed a guide pin and repeated it on the contralateral side.  Once all  4 pedicles were cannulated, I then checked final x-rays to ensure they were satisfactory.  I then tapped and then placed the pedicle screws. All 4 screws had excellent purchase.  Once this was done, I decorticated the L5-S1 facet complex with a high-speed bur.  I also decorticated the portion of the sacral ala in the transverse process.  Once this was done, I packed the bone graft into the posterior lateral horn and into the facet complex.  This was a combination of local bone harvested from the iliac crest as well as DBX mix.  Once all 4 screws were in place, I then measured and placed the appropriate size rod and locked it into place.  It should be noted that prior to placing the rod, I did stimulate each individual screw and they all tested well 38 and above for impedance indicating there was no breach.  The rods were then torqued off according to manufacturers standards.  The wound was copiously irrigated with normal saline.  I closed the deep fascia with interrupted #1 Vicryl sutures and superficial 2-0 Vicryl sutures.  The small Wiltse incisions used to advance the screws were irrigated out as well and closed in a similar fashion with #1 Vicryl sutures, 2-0 Vicryl sutures, and then 3-0 Monocryl for the skin.  At this point with the posterior fusion and instrumentation complete, I then turned my attention to the implantation of spinal cord stimulator.  I counted up from L5 until I identified the T10 spinous process.  According to the stimulator rep, they were programming at T9 and at the base of T8. Given this, I elected to perform a T10 laminotomy.  I again made a midline incision, sharply dissected down and exposed the T10 spinous process and T11 spinous process.  Once I had these completely exposed using bipolar electrocautery, Bovie on top, I placed a deep retractor. I again then counted up in the lateral plane to confirm the T10 lamina. Using a double-action Leksell rongeur, I  removed the bulk of the T10 spinous process.  I then used a fine nerve hook to develop a plane underneath the lamina, I then used 2-mm Kerrison punches to perform a generous laminotomy of T10.  I then used a Penfield 4 to create a plane between the ligamentum flavum and the thecal sac.  This was achieved by identifying the central raphe where there was a defect in the lamina.  I then resected the ligamentum flavum, dissected through the epidural fat and exposed the dorsal surface of the thecal sac.  I then used my spatula to gently pass and to confirm that there was a plane that I could put the spinal cord stimulator paddle.  The trial paddle went without any problem or hindrance.  I then  obtained the actual implant and gently slid it up so that it was in the midline.  X-rays confirmed satisfactory position starting just at the inferior aspect of T8 vertebral body spanning to 8-9 disk space and the entire T9 vertebral body.  According to the rep, this was in the proper area where they were stimulating during the trial.  At this point, I then made 2 bone holes into the T11 spinous process and sutured the leads directly to the bone using 2-0 FiberWire.  With the lead secured, I then made a left gluteal incision and created a pocket about 2 mm in depth for the battery.  I then used the submuscular passer and passed the leads from the thoracic wound to the gluteal wound.  I secured them to the battery and torqued them according to manufacture's standards, so they were tight.  I then placed the battery into the pouch and then stimulated the battery to ensure that it was functioning fine.  Once this was confirmed, I then irrigated both wounds copiously with normal saline.  I then closed the deep fascia of both wounds with interrupted #1 Vicryl sutures, superficial with 2-0 Vicryl sutures, and 3-0 Monocryl for the skin.  Steri-Strips and dry dressing were applied to all wounds. The patient was  ultimately extubated, transferred to the PACU without incident.  At the end of the case, all needle and sponge counts were correct.  There were no adverse intraoperative events.     Sena Hoopingarner D. Shon BatonBrooks, M.D.     DDB/MEDQ  D:  10/17/2014  T:  10/18/2014  Job:  [id number]

## 2014-10-19 ENCOUNTER — Inpatient Hospital Stay (HOSPITAL_COMMUNITY): Payer: Worker's Compensation

## 2014-10-19 MED ORDER — DOCUSATE SODIUM 100 MG PO CAPS: 100.0000 mg | ORAL_CAPSULE | Freq: Three times a day (TID) | ORAL | Status: AC | PRN

## 2014-10-19 MED ORDER — ONDANSETRON HCL 4 MG PO TABS: 4.0000 mg | ORAL_TABLET | Freq: Three times a day (TID) | ORAL | Status: AC | PRN

## 2014-10-19 MED ORDER — OXYCODONE-ACETAMINOPHEN 10-325 MG PO TABS: 1.0000 | ORAL_TABLET | ORAL | Status: AC | PRN

## 2014-10-19 MED ORDER — DIAZEPAM 10 MG PO TABS: 10.0000 mg | ORAL_TABLET | Freq: Three times a day (TID) | ORAL | Status: AC | PRN

## 2014-10-19 MED ORDER — OXYCODONE HCL ER 15 MG PO T12A: 15.0000 mg | EXTENDED_RELEASE_TABLET | Freq: Two times a day (BID) | ORAL | Status: AC

## 2014-10-19 NOTE — Care Management (Signed)
Utilization review completed. Zyrion Coey, RN Case Manager [phone]. 

## 2014-10-19 NOTE — Progress Notes (Signed)
Patient with sharp gas pain throughout the evening. Pt with small bowel movement after fleet enema but still with discomfort this evening. History of ileus in the past. KUB ordered. MD will read results in the am. Will continue to monitor. Colby Reels, [place] ScrapeSarah E, RN

## 2014-10-19 NOTE — Care Management Note (Signed)
Case Management Note  Patient Details  Name: Glorianne ManchesterBrandon M Barfield MRN: [id number] Date of Birth: 07/04/1980  Subjective/Objective:          S/p spinal cord stimulator placement          Action/Plan: OT recommended 3N1and a tub seat with a back. Spoke with patient and his wife and then called his worker's comp case Arboriculturistmanager Robin Stockner [phone]. Left voicemail. Will fax orders and clinical information to Robin at [phone] so that equipment can be delivered to patient's home if approved by the adjustor.  Expected Discharge Date:                  Expected Discharge Plan:  Home/Self Care  In-House Referral:  NA  Discharge planning Services  CM Consult  Post Acute Care Choice:  Durable Medical Equipment Choice offered to:  NA  DME Arranged:  3-N-1, Tub bench DME Agency:     HH Arranged:    HH Agency:     Status of Service:  In process, will continue to follow  Medicare Important Message Given:    Date Medicare IM Given:    Medicare IM give by:    Date Additional Medicare IM Given:    Additional Medicare Important Message give by:     If discussed at Long Length of Stay Meetings, dates discussed:    Additional Comments:  Monica BectonKrieg, Crawford Tamura Watson, RN 10/19/2014, 11:05 AM

## 2014-10-19 NOTE — Progress Notes (Signed)
Physical Therapy Treatment Patient Details Name: Glorianne ManchesterBrandon M Criss MRN: [id number] DOB: 1980-07-19 Today's Date: 10/19/2014    History of Present Illness Pt is a 34 y/o male with a PMH of L5-S1 lumbar anterior interbody fusion in 06/2013. Pt has developed pseudoarthritis and presented on 6/29 for Posterior fusion L5-S1, iliac crest bone graft harvest on the R, and implantation of spinal cord stimulator from T10 laminotomy.    PT Comments    Patient continues to be gaurded with movement but progressing well with mobility. He had walked twice with his family last night and once this morning. He is requesting a BSC for home use. RN aware. Patient safe to D/C from a mobility standpoint based on progression towards goals set on PT eval.    Follow Up Recommendations  Outpatient PT;Supervision for mobility/OOB     Equipment Recommendations  3in1 (PT)    Recommendations for Other Services       Precautions / Restrictions Precautions Precautions: Fall;Back Required Braces or Orthoses: Spinal Brace Spinal Brace: Lumbar corset    Mobility  Bed Mobility Overal bed mobility: Needs Assistance Bed Mobility: Sit to Sidelying         Sit to sidelying: Supervision General bed mobility comments: Cues for technique  Transfers Overall transfer level: Needs assistance Equipment used: None   Sit to Stand: Supervision            Ambulation/Gait Ambulation/Gait assistance: Supervision Ambulation Distance (Feet): 250 Feet Assistive device: None Gait Pattern/deviations: Step-through pattern;Decreased stride length Gait velocity: guarded   General Gait Details: Pt moving very slowly and appears stiff in all movement.    Stairs            Wheelchair Mobility    Modified Rankin (Stroke Patients Only)       Balance                                    Cognition Arousal/Alertness: Awake/alert Behavior During Therapy: WFL for tasks assessed/performed Overall  Cognitive Status: Within Functional Limits for tasks assessed                      Exercises      General Comments        Pertinent Vitals/Pain Pain Score: 7  Pain Location: back  Pain Descriptors / Indicators: Sore Pain Intervention(s): Monitored during session    Home Living                      Prior Function            PT Goals (current goals can now be found in the care plan section) Progress towards PT goals: Progressing toward goals    Frequency  Min 5X/week    PT Plan Current plan remains appropriate    Co-evaluation             End of Session Equipment Utilized During Treatment: Back brace Activity Tolerance: Patient tolerated treatment well Patient left: in bed;with call bell/phone within reach     Time: 0745-0801 PT Time Calculation (min) (ACUTE ONLY): 16 min  Charges:  $Gait Training: 8-22 mins                    G Codes:      Fredrich BirksRobinette, Julia Elizabeth 10/19/2014, 10:06 AM 10/19/2014 Fredrich Birksobinette, Julia Elizabeth PTA [phone] pager [phone] office

## 2014-10-19 NOTE — Discharge Summary (Signed)
Patient ID: Ryan Boyd MRN: [id number] DOB/AGE: 08/17/1980 34 y.o.  Admit date: 10/17/2014 Discharge date: 10/19/2014  Admission Diagnoses:  Active Problems:   Chronic pain   Discharge Diagnoses:  Active Problems:   Chronic pain  status post Procedure(s): POSTERIOR SPINAL FUSION INTERBODY  L5-S1 ILIAC CREST BONE GRAFT SPINAL CORD STIMULATOR PLACEMENT     Past Medical History  Diagnosis Date  . Back pain, chronic     Surgeries: Procedure(s): POSTERIOR SPINAL FUSION INTERBODY  L5-S1 ILIAC CREST BONE GRAFT SPINAL CORD STIMULATOR PLACEMENT    on 10/17/2014   Consultants:  none  Discharged Condition: Improved  Hospital Course: CLARY MEEKER is an 34 y.o. male who was admitted 10/17/2014 for operative treatment of pseudoarthrosis and chronic pain. Patient failed conservative treatments (please see the history and physical for the specifics) and had severe unremitting pain that affects sleep, daily activities and work/hobbies. After pre-op clearance, the patient was taken to the operating room on 10/17/2014 and underwent  Procedure(s): POSTERIOR SPINAL FUSION INTERBODY  L5-S1 ILIAC CREST BONE GRAFT SPINAL CORD STIMULATOR PLACEMENT   .    Patient was given perioperative antibiotics: Anti-infectives    Start     Dose/Rate Route Frequency Ordered Stop   10/17/14 2000  ceFAZolin (ANCEF) IVPB 1 g/50 mL premix     1 g 100 mL/hr over 30 Minutes Intravenous Every 8 hours 10/17/14 1518 10/18/14 0404   10/17/14 0622  ceFAZolin (ANCEF) IVPB 2 g/50 mL premix     2 g 100 mL/hr over 30 Minutes Intravenous 30 min pre-op 10/17/14 0622 10/17/14 1251       Patient was given sequential compression devices and early ambulation to prevent DVT.   Patient benefited maximally from hospital stay and there were no complications. At the time of discharge, the patient was urinating/moving their bowels without difficulty, tolerating a regular diet, pain is controlled with oral pain medications  and they have been cleared by PT/OT.   Recent vital signs: Patient Vitals for the past 24 hrs:  BP Temp Temp src Pulse Resp SpO2  10/19/14 0400 122/63 mmHg 98.9 F (37.2 C) Oral (!) 108 20 95 %  10/18/14 2342 113/63 mmHg 99.6 F (37.6 C) Oral (!) 117 20 96 %  10/18/14 1958 (!) 113/56 mmHg 99.6 F (37.6 C) Oral (!) 117 18 96 %  10/18/14 1651 120/73 mmHg 99.6 F (37.6 C) - (!) 116 20 93 %  10/18/14 1100 123/69 mmHg (!) 100.4 F (38 C) - (!) 118 20 94 %  10/18/14 0755 92/60 mmHg 98.6 F (37 C) Oral (!) 120 20 95 %     Recent laboratory studies: No results for input(s): WBC, HGB, HCT, PLT, NA, K, CL, CO2, BUN, CREATININE, GLUCOSE, INR, CALCIUM in the last 72 hours.  Invalid input(s): PT, 2   Discharge Medications:     Medication List    STOP taking these medications        acetaminophen 500 MG tablet  Commonly known as:  TYLENOL     cloNIDine 0.1 MG tablet  Commonly known as:  CATAPRES     lidocaine 5 %  Commonly known as:  LIDODERM     meloxicam 15 MG tablet  Commonly known as:  MOBIC     methocarbamol 500 MG tablet  Commonly known as:  ROBAXIN     traZODone 50 MG tablet  Commonly known as:  DESYREL      TAKE these medications  diazepam 10 MG tablet  Commonly known as:  VALIUM  Take 1 tablet (10 mg total) by mouth every 8 (eight) hours as needed for anxiety (muscle spasms).     docusate sodium 100 MG capsule  Commonly known as:  COLACE  Take 1 capsule (100 mg total) by mouth 3 (three) times daily as needed for mild constipation.     ondansetron 4 MG tablet  Commonly known as:  ZOFRAN  Take 1 tablet (4 mg total) by mouth every 8 (eight) hours as needed for nausea or vomiting.     OxyCODONE 15 mg T12a 12 hr tablet  Commonly known as:  OXYCONTIN  Take 1 tablet (15 mg total) by mouth every 12 (twelve) hours.     oxyCODONE-acetaminophen 10-325 MG per tablet  Commonly known as:  PERCOCET  Take 1 tablet by mouth every 4 (four) hours as needed for pain.          Diagnostic Studies: Dg Thoracic Spine 2 View  10/17/2014   CLINICAL DATA:  Intraoperative imaging for spine stimulator placement  EXAM: THORACIC SPINE - 2 VIEW  COMPARISON:  None.  FINDINGS: Two images of the thoracic spine showed spine stimulator leads projecting within the posterior aspect of the spinal canal. No bone lesion is seen.  IMPRESSION: Imaging for spine stimulator placement.   Electronically Signed   By: Amie Portlandavid  Ormond M.D.   On: 10/17/2014 13:21   Dg Lumbar Spine 2-3 Views  10/18/2014   CLINICAL DATA:  Lumbar fusion, L5-S1 with neural stimulator placement  EXAM: LUMBAR SPINE - 2-3 VIEW  COMPARISON:  Intraoperative imaging 10/17/2014  FINDINGS: Two views demonstrate posterior L5-S1 fusion with intervertebral disc spacer placement. Neural stimulator in place. No evidence for hardware failure or vertebral body compression.  IMPRESSION: Expected postoperative appearance after L5-S1 fusion.   Electronically Signed   By: Christiana PellantGretchen  Green M.D.   On: 10/18/2014 09:57   Dg Lumbar Spine 2-3 Views  10/17/2014   CLINICAL DATA:  L5-S1 PLIF  EXAM: LUMBAR SPINE - 2-3 VIEW; DG C-ARM 61-120 MIN  COMPARISON:  None  FLUOROSCOPY TIME:  2 minutes 58 seconds  Two images  FINDINGS: Two fluoroscopic spot images of the lumbosacral junction are provided. There is interval posterior lumbar fusion at L5-S1. There is evidence of prior anterior lumbar interbody fusion.  IMPRESSION: Interval L5-S1 PLIF.   Electronically Signed   By: Elige KoHetal  Patel   On: 10/17/2014 13:18   Dg C-arm 61-120 Min  10/17/2014   CLINICAL DATA:  L5-S1 PLIF  EXAM: LUMBAR SPINE - 2-3 VIEW; DG C-ARM 61-120 MIN  COMPARISON:  None  FLUOROSCOPY TIME:  2 minutes 58 seconds  Two images  FINDINGS: Two fluoroscopic spot images of the lumbosacral junction are provided. There is interval posterior lumbar fusion at L5-S1. There is evidence of prior anterior lumbar interbody fusion.  IMPRESSION: Interval L5-S1 PLIF.   Electronically Signed   By: Elige KoHetal   Patel   On: 10/17/2014 13:18          Follow-up Information    Follow up with Alvy BealBROOKS,Caidence Kaseman D, MD In 2 weeks.   Specialty:  Orthopedic Surgery   Why:  For wound re-check, For suture removal   Contact information:   [address] [place] [id number] [phone]       Discharge Plan:  discharge to home  Disposition: hospital course uneventful.  Patient stable at present.  KUB xray has been ordered will review prior to discharge.  Patient has had  bowel movement x 2.  No abdominal pain or distention.  Pain regimen working well to conrol pain and allow for sleep.  Wounds are clean and dry.  F./U in 2 weeks.    Signed: Venita Lick D for Dr. Venita Lick [facility] [phone] 10/19/2014, 7:34 AM

## 2014-10-19 NOTE — Progress Notes (Signed)
    Subjective: Procedure(s) (LRB): POSTERIOR SPINAL FUSION INTERBODY  L5-S1 ILIAC CREST BONE GRAFT (N/A) SPINAL CORD STIMULATOR PLACEMENT    (N/A) 2 Days Post-Op  Patient reports pain as 4 on 0-10 scale.  Reports decreased leg pain reports incisional back pain   Positive void Positive bowel movement Positive flatus Negative chest pain or shortness of breath  Objective: Vital signs in last 24 hours: Temp:  [98.6 F (37 C)-100.4 F (38 C)] 98.9 F (37.2 C) (07/01 0400) Pulse Rate:  [108-120] 108 (07/01 0400) Resp:  [18-20] 20 (07/01 0400) BP: (92-123)/(56-73) 122/63 mmHg (07/01 0400) SpO2:  [93 %-96 %] 95 % (07/01 0400)  Intake/Output from previous day: 06/30 0701 - 07/01 0700 In: 600 [P.O.:600] Out: -   Labs: No results for input(s): WBC, RBC, HCT, PLT in the last 72 hours. No results for input(s): NA, K, CL, CO2, BUN, CREATININE, GLUCOSE, CALCIUM in the last 72 hours. No results for input(s): LABPT, INR in the last 72 hours.  Physical Exam: Neurologically intact ABD soft Neurovascular intact Incision: dressing C/D/I Compartment soft  Assessment/Plan: Patient stable  Continue mobilization with physical therapy Continue care  Up with therapy  Ok for d/c to home Pain controlled with current regimen  Venita Lickahari Bain Whichard, MD [facility] [phone]

## 2014-10-19 NOTE — Progress Notes (Signed)
O Evaluation   10/18/14 1000  OT Visit Information  Last OT Received On 10/18/14  Assistance Needed +1  History of Present Illness Pt is a 34 y/o male with a PMH of L5-S1 lumbar anterior interbody fusion in 06/2013. Pt has developed pseudoarthritis and presented on 6/29 for Posterior fusion L5-S1, iliac crest bone graft harvest on the R, and implantation of spinal cord stimulator from T10 laminotomy.  Precautions  Precautions Fall;Back  Precaution Booklet Issued Yes (comment)  Precaution Comments Reviewed back precautions with pt  Required Braces or Orthoses Spinal Brace  Spinal Brace Lumbar corset  Restrictions  Weight Bearing Restrictions No  Home Living  Family/patient expects to be discharged to: Private residence  Living Arrangements Spouse/significant other  Available Help at Discharge Family  Type of Home House  Home Access Level entry  Home Layout One level  Home Equipment Walker - 2 wheels  Prior Function  Level of Independence Independent  Communication  Communication No difficulties  Pain Assessment  Pain Assessment 0-10  Pain Score 10  Pain Location back  Pain Descriptors / Indicators Aching;Burning;Constant  Pain Intervention(s) Limited activity within patient's tolerance;Repositioned;Patient requesting pain meds-RN notified  Cognition  Arousal/Alertness Awake/alert  Behavior During Therapy Flat affect  Overall Cognitive Status Within Functional Limits for tasks assessed  Upper Extremity Assessment  Upper Extremity Assessment Overall WFL for tasks assessed  Lower Extremity Assessment  Lower Extremity Assessment Defer to PT evaluation  RLE Deficits / Details Decreased strength and AROM consistent with neurogenic pain prior to surgery  Cervical / Trunk Assessment  Cervical / Trunk Assessment Normal  ADL  Overall ADL's  Needs assistance/impaired  Upper Body Bathing Set up;Sitting  Lower Body Bathing Moderate assistance;Sit to/from stand  Upper Body Dressing   Set up  Upper Body Dressing Details (indicate cue type and reason) able to independently donn/doff brace  Lower Body Dressing Moderate assistance;Sit to/from Scientist, research (life sciences)stand  Toilet Transfer Minimal assistance;Ambulation  Toileting- Clothing Manipulation and Hygiene Moderate assistance;Sit to/from stand  Functional mobility during ADLs Min guard  General ADL Comments Reviewed back precautions with pt/family. Reviewed use of compensatory techniques with pt/family. Session interrupted by x-ray. Pt/family asking for therapist to return tomorrow to educate on toileting and use of AE  Bed Mobility  Overal bed mobility Needs Assistance  Bed Mobility Sit to Sidelying  Sit to sidelying Min assist  General bed mobility comments educated pt/wife on technique. assist to lift leg onto bed due to pain  Transfers  Overall transfer level Needs assistance  Equipment used None  Sit to Stand Min guard  General transfer comment Pt was able to power-up to full standing position without assistance. Increased time required to scoot out to edge of chair and achieve full standing.  Balance  Overall balance assessment No apparent balance deficits (not formally assessed)  OT - End of Session  Equipment Utilized During Treatment Back brace  Activity Tolerance Patient limited by pain;Other (comment) (x-ray came to take pt to x-ray)  Patient left Other (comment) (in w/c)  Nurse Communication Mobility status;Patient requests pain meds  OT Assessment  OT Therapy Diagnosis  Generalized weakness;Acute pain  OT Recommendation/Assessment Patient needs continued OT Services  OT Problem List Decreased strength;Decreased activity tolerance;Decreased knowledge of use of DME or AE;Decreased knowledge of precautions;Pain  OT Plan  OT Treatment/Interventions (ACUTE ONLY) Self-care/ADL training;DME and/or AE instruction;Therapeutic activities;Patient/family education  OT Recommendation  Follow Up Recommendations No OT follow  up;Supervision/Assistance - 24 hour (initially)  OT Equipment None recommended by  OT  Individuals Consulted  Consulted and Agree with Results and Recommendations Patient;Family member/caregiver  Family Member Consulted wife  Acute Rehab OT Goals  Patient Stated Goal Decrease pain  OT Goal Formulation With patient  Time For Goal Achievement 11/01/14  Potential to Achieve Goals Good  OT Time Calculation  OT Start Time (ACUTE ONLY) 0850  OT Stop Time (ACUTE ONLY) 0905  OT Time Calculation (min) 15 min  OT General Charges  $OT Visit 1 Procedure  OT Evaluation  $Initial OT Evaluation Tier I 1 Procedure  Written Expression  Dominant Hand Right  Luisa Dago, OTR/L  [phone] 10/19/2014

## 2014-10-19 NOTE — Progress Notes (Signed)
Patient alert and oriented, mae's well, voiding adequate amount of urine, swallowing without difficulty, c/o moderate pain and meds given prior to discharged. Patient discharged home with family. Script and discharged instructions given to patient. Patient and family stated understanding of instructions given.  

## 2014-10-19 NOTE — Progress Notes (Addendum)
Patient c/o nausea after having 2 bowel movements and antiemetic given per protocol. MD was notified and orders received to discharged patient after patient rested and show sign of relief from medication. RN informed patient and spouse to get stool softener for home use while taking narcotics.

## 2014-10-19 NOTE — Progress Notes (Signed)
Occupational Therapy Treatment and Discharge Patient Details Name: Ryan ManchesterBrandon M Mainwaring MRN: [id number] DOB: 1980-10-14 Today's Date: 10/19/2014    History of present illness Pt is a 34 y/o male with a PMH of L5-S1 lumbar anterior interbody fusion in 06/2013. Pt has developed pseudoarthritis and presented on 6/29 for Posterior fusion L5-S1, iliac crest bone graft harvest on the R, and implantation of spinal cord stimulator from T10 laminotomy.   OT comments  This 34 yo male admitted with above presents to acute OT with all education completed with pt and wife and they do not have any further questions about BADLs, we will sign off.  Follow Up Recommendations  No OT follow up;Supervision/Assistance - 24 hour    Equipment Recommendations  None recommended by OT       Precautions / Restrictions Precautions Precautions: Fall;Back Required Braces or Orthoses: Spinal Brace Spinal Brace: Lumbar corset Restrictions Weight Bearing Restrictions: No              ADL                                         General ADL Comments: Pt and wife state that they got the toilet aid and reacher and that they understand how they are suppose to be used for BADLs. I went over the tecnique pt is suppose to use for stepping in and out of the tub.                Cognition   Behavior During Therapy: WFL for tasks assessed/performed Overall Cognitive Status: Within Functional Limits for tasks assessed                                    Pertinent Vitals/ Pain       Pain Score: 7  Pain Location: back  Pain Descriptors / Indicators: Sore Pain Intervention(s): Monitored during session         Progress Toward Goals  OT Goals(current goals can now be found in the care plan section)  Progress towards OT goals:  (All education completed)     Plan Discharge plan remains appropriate          Activity Tolerance Patient tolerated treatment well   Patient Left in  bed   Nurse Communication  (Pt needs a 3n1 and a tub seat)        Time: [id number] OT Time Calculation (min): 14 min  Charges: OT General Charges $OT Visit: 1 Procedure OT Treatments $Self Care/Home Management : 8-22 mins  Evette GeorgesLeonard, Ysmael Hires Eva [phone] 10/19/2014, 12:17 PM

## 2015-08-18 IMAGING — CR DG LUMBAR SPINE 2-3V
2 series · 2 of 2 positions shown · non-contrast
Comparison: 06/16/2012

CLINICAL DATA: Preoperative evaluation for anterior lumbar fusion.

EXAM:
LUMBAR SPINE - 2-3 VIEW

[t lumbar spine ap]
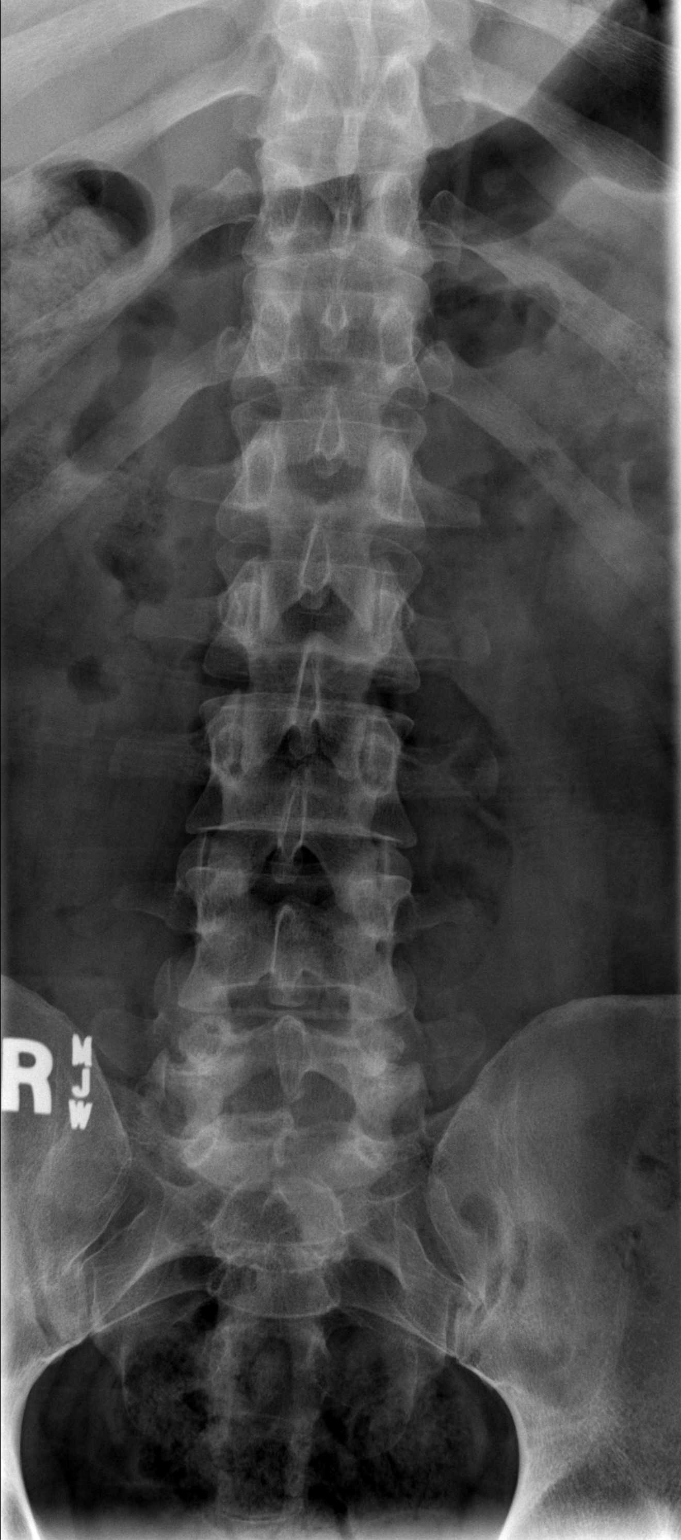

[t lumbar spine lat]
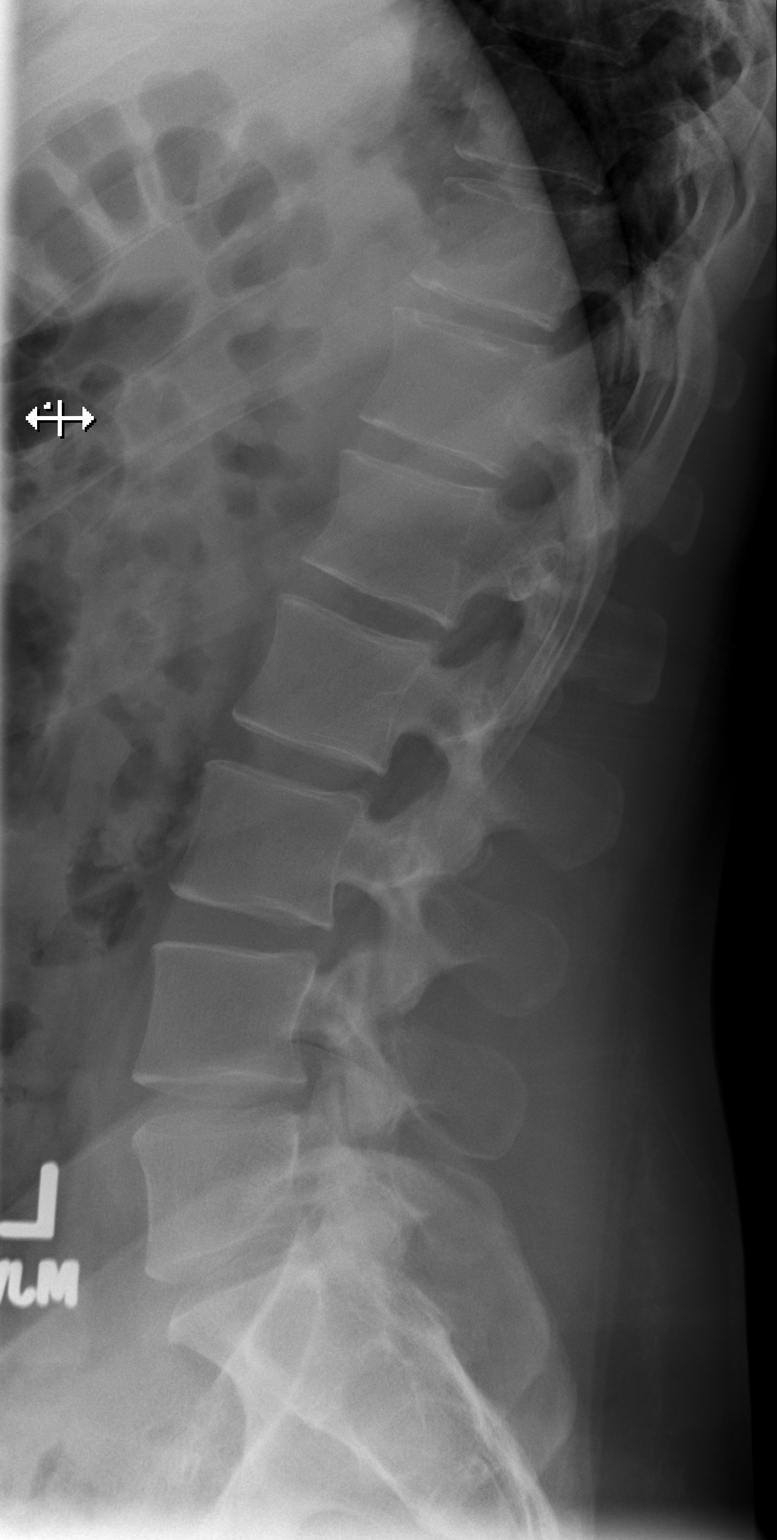

[2 of 2 positions shown; findings below may reference images not displayed]

FINDINGS: Normal alignment. Negative for fracture. Minor disc space narrowing
at L4-5 and L5-S1. Preserved vertebral body heights. Normal pedicles
and SI joints.
IMPRESSION: Minor lower lumbar degenerative disc disease. No acute finding by
plain radiography.

## 2016-12-07 IMAGING — RF DG THORACIC SPINE 2V
1 series · 1 of 1 positions shown · non-contrast
Comparison: None.

CLINICAL DATA: Intraoperative imaging for spine stimulator
placement

EXAM:
THORACIC SPINE - 2 VIEW

[Series 1: run · 1 of 1 slices shown]
[im 1/1]
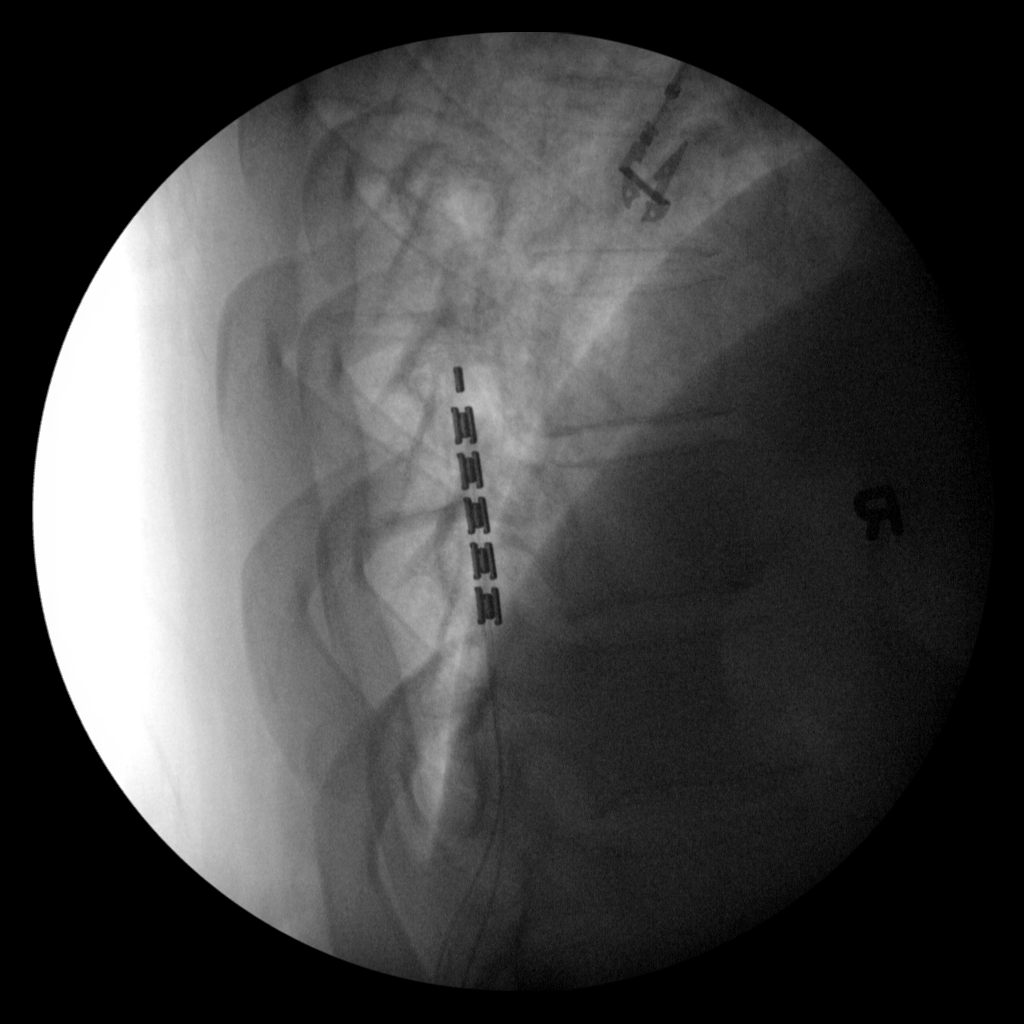

[1 of 1 positions shown; findings below may reference images not displayed]

FINDINGS: Two images of the thoracic spine showed spine stimulator leads
projecting within the posterior aspect of the spinal canal. No bone
lesion is seen.
IMPRESSION: Imaging for spine stimulator placement.
